# Patient Record
Sex: Female | Born: 2000 | Race: White | Hispanic: No | Marital: Single | Smoking: Never smoker
Health system: Southern US, Community
[De-identification: ages and names within clinical notes are randomized; demographics above are authoritative.]

## PROBLEM LIST (undated history)

## (undated) DIAGNOSIS — J3501 Chronic tonsillitis: Secondary | ICD-10-CM

## (undated) DIAGNOSIS — R55 Syncope and collapse: Secondary | ICD-10-CM

## (undated) DIAGNOSIS — K589 Irritable bowel syndrome without diarrhea: Secondary | ICD-10-CM

## (undated) DIAGNOSIS — G43909 Migraine, unspecified, not intractable, without status migrainosus: Secondary | ICD-10-CM

## (undated) DIAGNOSIS — G43109 Migraine with aura, not intractable, without status migrainosus: Secondary | ICD-10-CM

## (undated) HISTORY — DX: Migraine with aura, not intractable, without status migrainosus: G43.109

## (undated) HISTORY — PX: WISDOM TOOTH EXTRACTION: SHX21

## (undated) HISTORY — DX: Syncope and collapse: R55

---
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
## 2019-03-12 ENCOUNTER — Other Ambulatory Visit: Payer: Self-pay

## 2019-03-12 ENCOUNTER — Ambulatory Visit
Admission: EM | Admit: 2019-03-12 | Discharge: 2019-03-12 | Disposition: A | Discharge status: Home / Self Care | Payer: Medicaid Other | Attending: Family Medicine | Admitting: Family Medicine

## 2019-03-12 DIAGNOSIS — M7918 Myalgia, other site: Secondary | ICD-10-CM | POA: Diagnosis not present

## 2019-03-12 DIAGNOSIS — K589 Irritable bowel syndrome without diarrhea: Secondary | ICD-10-CM | POA: Diagnosis not present

## 2019-03-12 LAB — URINALYSIS, COMPLETE (UACMP) WITH MICROSCOPIC
Bilirubin Urine: NEGATIVE
Glucose, UA: NEGATIVE mg/dL
Hgb urine dipstick: NEGATIVE
Ketones, ur: NEGATIVE mg/dL
Leukocytes,Ua: NEGATIVE
Nitrite: NEGATIVE
Protein, ur: NEGATIVE mg/dL
Specific Gravity, Urine: >1.03 — ABNORMAL HIGH (ref 1.005–1.030)
pH: 5.5 (ref 5.0–8.0)

## 2019-03-12 MED ORDER — MELOXICAM 15 MG PO TABS: 15.0000 mg | ORAL_TABLET | Freq: Every day | ORAL | 0 refills | Status: DC | PRN

## 2019-03-12 NOTE — Discharge Instructions (Signed)
No evidence of UTI.  Exam unremarkable.  Medication as needed for pain.  Take care  Dr. Adriana Simas

## 2019-03-12 NOTE — ED Provider Notes (Signed)
[facility]    CSN: [id number] Arrival date & time: 03/12/19  1305  History   Chief Complaint Chief Complaint  Patient presents with  . Back Pain   HPI  19 year old female presents with multiple complaints.  Patient reports that her symptoms started on Sunday.  She reports neck pain, back pain, chest pain.  She states that her chest pain has now resolved.  However, she now has lower abdominal pain, left side pain, and continued back and neck pain.  Patient denies any urinary symptoms.  He had last rectal cycle was approximately 4 to 5 days ago.  Has known IBS.  Rates her pain as 3/10 in severity.  She has taken some muscle relaxers and use lidocaine patch for her back pain without resolution.  Back pain is bilateral.  No relieving factors.  No reports of fall, trauma, injury.  No other associated symptoms.  No other complaints.  Past Medical History:  Diagnosis Date  . Chronic tonsillitis   . Migraine with aura    every other day  . Syncope    Patient Active Problem List   Diagnosis Date Noted  . 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 TONSILLECTOMY N/A 09/13/2017   Procedure: TONSILLECTOMY;  Surgeon: Vernie Murders, MD;  Location: [facility];  Service: ENT;  Laterality: N/A;  NO ADENOIDS PER MD  . WISDOM TOOTH EXTRACTION      OB History    Gravida  0   Para  0   Term  0   Preterm  0   AB  0   Living  0     SAB  0   TAB  0   Ectopic  0   Multiple  0   Live Births  0            Home Medications    Prior to Admission medications   Medication Sig Start Date End Date Taking? Authorizing Provider  amitriptyline (ELAVIL) 25 MG tablet TAKE 1 TABLET BY MOUTH ONCE DAILY FOR 30 DAYS 08/08/18  Yes [provider]  dicyclomine (BENTYL) 10 MG capsule TAKE 1 CAPSULE BY MOUTH THREE TIMES DAILY AS NEEDED FOR STOMACH 08/08/18  Yes [provider]  meloxicam (MOBIC) 15 MG tablet Take 1  tablet (15 mg total) by mouth daily as needed for pain. 03/12/19   Tommie Sams, DO    Family History Family History  Problem Relation Age of Onset  . Cystic fibrosis Mother 65    Social History Social History   Tobacco Use  . Smoking status: Never Smoker  . Smokeless tobacco: Never Used  Substance Use Topics  . Alcohol use: No  . Drug use: No     Allergies   Patient has no known allergies.   Review of Systems Review of Systems  Cardiovascular: Positive for chest pain.  Gastrointestinal: Positive for abdominal pain.  Musculoskeletal: Positive for back pain and neck pain.   Physical Exam Triage Vital Signs ED Triage Vitals  Enc Vitals Group     BP 03/12/19 1323 121/70     Pulse Rate 03/12/19 1323 81     Resp 03/12/19 1323 18     Temp 03/12/19 1323 98.5 F (36.9 C)     Temp Source 03/12/19 1323 Oral     SpO2 03/12/19 1323 100 %     Weight 03/12/19 1321 140 lb (63.5 kg)     Height 03/12/19 1321  4\' 11"  (1.499 m)     Head Circumference --      Peak Flow --      Pain Score 03/12/19 1320 3     Pain Loc --      Pain Edu? --      Excl. in GC? --    Updated Vital Signs BP 121/70 (BP Location: Left Arm)   Pulse 81   Temp 98.5 F (36.9 C) (Oral)   Resp 18   Ht 4\' 11"  (1.499 m)   Wt 63.5 kg   LMP 03/07/2019   SpO2 100%   BMI 28.28 kg/m   Visual Acuity Right Eye Distance:   Left Eye Distance:   Bilateral Distance:    Right Eye Near:   Left Eye Near:    Bilateral Near:     Physical Exam Vitals and nursing note reviewed.  Constitutional:      General: She is not in acute distress.    Appearance: Normal appearance. She is not ill-appearing.  HENT:     Head: Normocephalic and atraumatic.  Eyes:     General:        Right eye: No discharge.        Left eye: No discharge.     Conjunctiva/sclera: Conjunctivae normal.  Cardiovascular:     Rate and Rhythm: Normal rate and regular rhythm.     Heart sounds: No murmur.  Pulmonary:     Effort: Pulmonary  effort is normal.     Breath sounds: Normal breath sounds. No wheezing, rhonchi or rales.  Abdominal:     General: There is no distension.     Palpations: Abdomen is soft.     Tenderness: There is no abdominal tenderness.  Musculoskeletal:     Comments: No lumbar spine tenderness to palpation.  Neurological:     Mental Status: She is alert.  Psychiatric:        Mood and Affect: Mood normal.        Behavior: Behavior normal.     UC Treatments / esults  Labs (all labs ordered are listed, but only abnormal results are displayed) Labs Reviewed  URINALYSIS, COMPLETE (UACMP) WITH MICROSCOPIC - Abnormal; Notable for the following components:      Result Value   APPearance HAZY (*)    Specific Gravity, Urine >1.030 (*)    Bacteria, UA RARE (*)    All other components within normal limits    EKG   Radiology No results found.  Procedures Procedures (including critical care time)  Medications Ordered in UC Medications - No data to display  Initial Impression / Assessment and Plan / UC Course  I have reviewed the triage vital signs and the nursing notes.  Pertinent labs & imaging results that were available during my care of the patient were reviewed by me and considered in my medical decision making (see chart for details).    19 year old female presents with musculoskeletal pain.  Patient is abdominal pain likely secondary to IBS.  She is well-appearing.  No evidence of UTI.  Meloxicam as directed.  Final Clinical Impressions(s) / UC Diagnoses   Final diagnoses:  Musculoskeletal pain  Irritable bowel syndrome, unspecified type     Discharge Instructions     No evidence of UTI.  Exam unremarkable.  Medication as needed for pain.  Take care  Dr. 03/09/2019    ED Prescriptions    Medication Sig Dispense Auth. Provider   meloxicam (MOBIC) 15 MG tablet Take 1 tablet (15  mg total) by mouth daily as needed for pain. 30 tablet Coral Spikes, DO     PDMP not reviewed  this encounter.   Coral Spikes, [place] 03/12/19 1435

## 2019-03-12 NOTE — ED Triage Notes (Signed)
Patient complains of low back pain that started on Sunday. States that she did take some muscle relaxers and lidocaine patch. Patient states that now she is having lower abdominal pain but reports that she has IBS but this feels different, has reported some flank pain.

## 2019-03-14 DIAGNOSIS — Z111 Encounter for screening for respiratory tuberculosis: Secondary | ICD-10-CM | POA: Diagnosis not present

## 2019-03-14 DIAGNOSIS — Z3049 Encounter for surveillance of other contraceptives: Secondary | ICD-10-CM | POA: Diagnosis not present

## 2019-04-23 ENCOUNTER — Telehealth: Payer: Self-pay

## 2019-04-23 DIAGNOSIS — R102 Pelvic and perineal pain: Secondary | ICD-10-CM

## 2019-04-23 NOTE — Telephone Encounter (Signed)
Patient called after hours/on call service to report lower abdominal pain and some bleeding. She was advised to report to ED. Records reviewed. No ED visit seen.

## 2019-04-23 NOTE — Telephone Encounter (Signed)
Pls call pt to sched, order placed. I'll call pt with results. Thx

## 2019-04-23 NOTE — Telephone Encounter (Signed)
Pt wants to go ahead and get u/s. She says pain she has doesn't feel like cramping "its more than just cramping" (her words).

## 2019-04-23 NOTE — Telephone Encounter (Signed)
Patient is schedule for 04/24/19

## 2019-04-23 NOTE — Telephone Encounter (Signed)
Cramping and bleeding normal with depo. Can check u/s if cramping persists, but most likely normal. Can do ibup with IBS and may need to do it short term till cramps improved. F/u prn.

## 2019-04-23 NOTE — Telephone Encounter (Signed)
Spoke w/patient to f/u. She didn't see the need to go to the ED. She reports she had been off depo for 6-7 months and restarted it thru her pediatrician on 03/14/19. Her LMP prior to recent depo was ~03/05/19. She has not had any bleeding since she received the depo until about 4 days ago (04/19/19). Pt reports bleeding started as light spotting and states that now it's heavy. Inquired w/patient if she is using pads or tampons. She reports she is using a light tampon. Explained our heavy bleeding protocol of completely filling regular size pad and having to change more than 1xqh. Patient now reports it's not heavy. She has tried heating pad and 800 Ibuprofen qd (she doesn't like to use d/t IBS). She has also tried tyelenol w/little to no relief. She states the pain is over her entire lower abdomen and is worse than a cramp (not like any pain she has ever had before). She reports only thing that helps is standing up. She is at work. Advised will send to [facility] for review/advice.

## 2019-04-24 ENCOUNTER — Ambulatory Visit (INDEPENDENT_AMBULATORY_CARE_PROVIDER_SITE_OTHER): Payer: Medicaid Other

## 2019-04-24 ENCOUNTER — Other Ambulatory Visit: Payer: Self-pay

## 2019-04-24 DIAGNOSIS — R102 Pelvic and perineal pain: Secondary | ICD-10-CM

## 2019-04-24 NOTE — Telephone Encounter (Signed)
Pt aware of neg GYN u/s for cramping with depo and BTB. Started depo 03/14/19. Having light bleeding. Hx of IBS too. Feels better today. Reassurance. F/u prn.

## 2019-04-28 ENCOUNTER — Encounter: Payer: Self-pay | Admitting: Obstetrics and Gynecology

## 2019-06-10 ENCOUNTER — Ambulatory Visit: Payer: Medicaid Other

## 2019-06-16 ENCOUNTER — Ambulatory Visit (INDEPENDENT_AMBULATORY_CARE_PROVIDER_SITE_OTHER): Payer: Medicaid Other | Admitting: Obstetrics and Gynecology

## 2019-06-16 ENCOUNTER — Other Ambulatory Visit (HOSPITAL_COMMUNITY)
Admission: RE | Admit: 2019-06-16 | Discharge: 2019-06-16 | Disposition: A | Discharge status: Home / Self Care | Payer: Medicaid Other | Source: Ambulatory Visit | Attending: Obstetrics and Gynecology | Admitting: Obstetrics and Gynecology

## 2019-06-16 ENCOUNTER — Encounter: Payer: Self-pay | Admitting: Obstetrics and Gynecology

## 2019-06-16 ENCOUNTER — Other Ambulatory Visit: Payer: Self-pay

## 2019-06-16 VITALS — BP 98/60 | Ht 60.0 in | Wt 147.0 lb

## 2019-06-16 DIAGNOSIS — Z3042 Encounter for surveillance of injectable contraceptive: Secondary | ICD-10-CM | POA: Diagnosis not present

## 2019-06-16 DIAGNOSIS — Z113 Encounter for screening for infections with a predominantly sexual mode of transmission: Secondary | ICD-10-CM | POA: Insufficient documentation

## 2019-06-16 DIAGNOSIS — N941 Unspecified dyspareunia: Secondary | ICD-10-CM

## 2019-06-16 DIAGNOSIS — N921 Excessive and frequent menstruation with irregular cycle: Secondary | ICD-10-CM | POA: Diagnosis not present

## 2019-06-16 LAB — POCT URINE PREGNANCY: Preg Test, Ur: NEGATIVE

## 2019-06-16 MED ORDER — MEDROXYPROGESTERONE ACETATE 150 MG/ML IM SUSP
150.0000 mg | Freq: Once | INTRAMUSCULAR | Status: AC
  Administered 2019-06-16: 150 mg via INTRAMUSCULAR

## 2019-06-16 NOTE — Addendum Note (Signed)
Addended by: Donnetta Hail on: 06/16/2019 04:17 PM   Modules accepted: Orders

## 2019-06-16 NOTE — Progress Notes (Signed)
[place] [employer] Complaint  Patient presents with  . Contraception    Has qs regarding depo, still having vaginal bleeding, pain with intercourse (for the past month)    HPI:      [name] is a 19 y.o. G0P0000 whose LMP was No LMP recorded. Patient has had an injection., presents today for dyspareunia for the past month. Has sharp pelvic pains during sex and has to stop. Sx stop with cessation. Sx improved this wk though. No pelvic pain otherwise. No vag sx. Has IBS, so hard to know about GI sx. No new partners. Neg STD testing 9/20.   Restarted depo 3/21 after being off it for 6-7 months. Having BTB almost daily since restarted. Had neg GYN u/s 4/21 after pt had concerns about quantity of bleeding. Late for 2nd depo today. Would like to cont for now. Hx of migraines with aura.    Past Medical History:  Diagnosis Date  . Chronic tonsillitis   . Migraine with aura    every other day  . Syncope     Past Surgical History:  Procedure Laterality Date  . TONSILLECTOMY N/A 09/13/2017   Procedure: TONSILLECTOMY;  Surgeon: Margaretha Sheffield, MD;  Location: [place];  Service: ENT;  Laterality: N/A;  NO ADENOIDS PER MD  . WISDOM TOOTH EXTRACTION      Family History  Problem Relation Age of Onset  . Cystic fibrosis Mother 22    Social History   Socioeconomic History  . Marital status: Single    Spouse name: Not on file  . Number of children: Not on file  . Years of education: Not on file  . Highest education level: Not on file  Occupational History  . Not on file  Tobacco Use  . Smoking status: Never Smoker  . Smokeless tobacco: Never Used  Substance and Sexual Activity  . Alcohol use: No  . Drug use: No  . Sexual activity: Yes    Birth control/protection: Injection  Other Topics Concern  . Not on file  Social History Narrative  . Not on file   Social Determinants of Health   Financial Resource Strain:   . Difficulty of  Paying Living Expenses:   Food Insecurity:   . Worried About Charity fundraiser in the Last Year:   . Arboriculturist in the Last Year:   Transportation Needs:   . Film/video editor (Medical):   Marland Kitchen Lack of Transportation (Non-Medical):   Physical Activity:   . Days of Exercise per Week:   . Minutes of Exercise per Session:   Stress:   . Feeling of Stress :   Social Connections:   . Frequency of Communication with Friends and Family:   . Frequency of Social Gatherings with Friends and Family:   . Attends Religious Services:   . Active Member of Clubs or Organizations:   . Attends Archivist Meetings:   Marland Kitchen Marital Status:   Intimate Partner Violence:   . Fear of Current or Ex-Partner:   . Emotionally Abused:   Marland Kitchen Physically Abused:   . Sexually Abused:     Outpatient Medications Prior to Visit  Medication Sig Dispense Refill  . amitriptyline (ELAVIL) 25 MG tablet TAKE 1 TABLET BY MOUTH ONCE DAILY FOR 30 DAYS    . dicyclomine (BENTYL) 10 MG capsule TAKE 1 CAPSULE BY MOUTH THREE TIMES DAILY AS NEEDED FOR STOMACH    . medroxyPROGESTERone (  DEPO-PROVERA) 150 MG/ML injection INJECT 1 VIAL INTRAMUSCULARLY    . meloxicam (MOBIC) 15 MG tablet Take 1 tablet (15 mg total) by mouth daily as needed for pain. 30 tablet 0   No facility-administered medications prior to visit.      ROS:  Review of Systems  Constitutional: Negative for fever.  Gastrointestinal: Negative for blood in stool, constipation, diarrhea, nausea and vomiting.  Genitourinary: Positive for dyspareunia and menstrual problem. Negative for dysuria, flank pain, frequency, hematuria, pelvic pain, urgency, vaginal bleeding, vaginal discharge and vaginal pain.  Musculoskeletal: Negative for back pain.  Skin: Negative for rash.    OBJECTIVE:   Vitals:  BP 98/60   Ht 5' (1.524 m)   Wt 147 lb (66.7 kg)   BMI 28.71 kg/m   Physical Exam Vitals reviewed.  Constitutional:      Appearance: She is  well-developed.  Pulmonary:     Effort: Pulmonary effort is normal.  Abdominal:     Palpations: Abdomen is soft.     Tenderness: There is no abdominal tenderness.  Genitourinary:    General: Normal vulva.     Pubic Area: No rash.      Labia:        Right: No rash, tenderness or lesion.        Left: No rash, tenderness or lesion.      Vagina: Normal. No vaginal discharge, erythema or tenderness.     Cervix: Normal.     Uterus: Normal. Not enlarged and not tender.      Adnexa: Right adnexa normal and left adnexa normal.       Right: No mass or tenderness.         Left: No mass or tenderness.    Musculoskeletal:        General: Normal range of motion.     Cervical back: Normal range of motion.  Skin:    General: Skin is warm and dry.  Neurological:     General: No focal deficit present.     Mental Status: She is alert and oriented to person, place, and time.  Psychiatric:        Mood and Affect: Mood normal.        Behavior: Behavior normal.        Thought Content: Thought content normal.        Judgment: Judgment normal.     Results: Results for orders placed or performed in visit on 06/16/19 (from the past 24 hour(s))  POCT urine pregnancy     Status: Normal   Collection Time: 06/16/19  3:34 PM  Result Value Ref Range   Preg Test, Ur Negative Negative     Assessment/Plan: Dyspareunia in female--Sx improving. Neg GYN u/s 4/21. Neg exam today. Check STDs. If neg, f/u prn sx. Could be GI related.   Breakthrough bleeding on depo provera--2nd shot today. Discussed cessation of bleeding 80% of the time by 9 months. Late with depo today. F/u prn.   Screening for STD (sexually transmitted disease) - Plan: Cervicovaginal ancillary only  Encounter for Depo-Provera contraception - Plan: POCT urine pregnancy    Return if symptoms worsen or fail to improve.  Nelta Caudill B. Marbella Markgraf, PA-C 06/16/2019 3:53 PM

## 2019-06-16 NOTE — Patient Instructions (Signed)
I value your feedback and entrusting us with your care. If you get a [facility] patient survey, I would appreciate you taking the time to let us know about your experience today. Thank you!  As of December 19, 2018, your lab results will be released to your MyChart immediately, before I even have a chance to see them. Please give me time to review them and contact you if there are any abnormalities. Thank you for your patience.  

## 2019-06-18 LAB — CERVICOVAGINAL ANCILLARY ONLY
Chlamydia: NEGATIVE
Comment: NEGATIVE
Comment: NORMAL
Neisseria Gonorrhea: NEGATIVE

## 2019-09-05 ENCOUNTER — Ambulatory Visit: Payer: Medicaid Other

## 2019-09-09 ENCOUNTER — Ambulatory Visit (INDEPENDENT_AMBULATORY_CARE_PROVIDER_SITE_OTHER): Payer: BC Managed Care – PPO

## 2019-09-09 ENCOUNTER — Other Ambulatory Visit: Payer: Self-pay

## 2019-09-09 DIAGNOSIS — Z3042 Encounter for surveillance of injectable contraceptive: Secondary | ICD-10-CM | POA: Diagnosis not present

## 2019-09-09 MED ORDER — MEDROXYPROGESTERONE ACETATE 150 MG/ML IM SUSP
150.0000 mg | Freq: Once | INTRAMUSCULAR | Status: AC
  Administered 2019-09-09: 150 mg via INTRAMUSCULAR

## 2019-09-09 NOTE — Progress Notes (Signed)
Pt here for depo which was given IM right glut.  NDC# 0548-5400-00 

## 2019-10-22 DIAGNOSIS — Z23 Encounter for immunization: Secondary | ICD-10-CM | POA: Diagnosis not present

## 2019-10-22 DIAGNOSIS — G43709 Chronic migraine without aura, not intractable, without status migrainosus: Secondary | ICD-10-CM | POA: Diagnosis not present

## 2019-11-21 DIAGNOSIS — Z79899 Other long term (current) drug therapy: Secondary | ICD-10-CM | POA: Diagnosis not present

## 2019-11-21 DIAGNOSIS — G43009 Migraine without aura, not intractable, without status migrainosus: Secondary | ICD-10-CM | POA: Diagnosis not present

## 2019-12-02 ENCOUNTER — Ambulatory Visit (INDEPENDENT_AMBULATORY_CARE_PROVIDER_SITE_OTHER): Payer: Medicaid Other

## 2019-12-02 ENCOUNTER — Other Ambulatory Visit: Payer: Self-pay

## 2019-12-02 DIAGNOSIS — Z3042 Encounter for surveillance of injectable contraceptive: Secondary | ICD-10-CM | POA: Diagnosis not present

## 2019-12-02 MED ORDER — MEDROXYPROGESTERONE ACETATE 150 MG/ML IM SUSP
150.0000 mg | Freq: Once | INTRAMUSCULAR | Status: AC
  Administered 2019-12-02: 150 mg via INTRAMUSCULAR

## 2019-12-02 NOTE — Progress Notes (Signed)
Patient presents today for Depo Provera injection within dates. Given IM RUOQ. Patient tolerated well. 

## 2019-12-05 DIAGNOSIS — G43009 Migraine without aura, not intractable, without status migrainosus: Secondary | ICD-10-CM | POA: Diagnosis not present

## 2020-01-08 DIAGNOSIS — H04123 Dry eye syndrome of bilateral lacrimal glands: Secondary | ICD-10-CM | POA: Diagnosis not present

## 2020-01-08 DIAGNOSIS — R519 Headache, unspecified: Secondary | ICD-10-CM | POA: Diagnosis not present

## 2020-01-22 DIAGNOSIS — H5213 Myopia, bilateral: Secondary | ICD-10-CM | POA: Diagnosis not present

## 2020-01-28 ENCOUNTER — Other Ambulatory Visit: Payer: Self-pay

## 2020-01-28 ENCOUNTER — Ambulatory Visit
Admission: EM | Admit: 2020-01-28 | Discharge: 2020-01-28 | Disposition: A | Discharge status: Home / Self Care | Payer: Medicaid Other | Attending: Sports Medicine | Admitting: Sports Medicine

## 2020-01-28 ENCOUNTER — Encounter: Payer: Self-pay | Admitting: Emergency Medicine

## 2020-01-28 DIAGNOSIS — Z793 Long term (current) use of hormonal contraceptives: Secondary | ICD-10-CM | POA: Insufficient documentation

## 2020-01-28 DIAGNOSIS — U071 COVID-19: Secondary | ICD-10-CM | POA: Insufficient documentation

## 2020-01-28 DIAGNOSIS — Z79899 Other long term (current) drug therapy: Secondary | ICD-10-CM | POA: Insufficient documentation

## 2020-01-28 DIAGNOSIS — R519 Headache, unspecified: Secondary | ICD-10-CM | POA: Diagnosis present

## 2020-01-28 DIAGNOSIS — J069 Acute upper respiratory infection, unspecified: Secondary | ICD-10-CM | POA: Diagnosis not present

## 2020-01-28 LAB — SARS CORONAVIRUS 2 (TAT 6-24 HRS): SARS Coronavirus 2: POSITIVE — AB

## 2020-01-28 NOTE — Discharge Instructions (Addendum)
Isolate at home until the results of your COVID test are back. If you are positive for COVID then you will have to quarantine for 5 days from when your symptoms started. After the 5 days you can break quarantine if your symptoms have improved and you have not had a fever for 24 hours without taking Tylenol or ibuprofen.  Use over-the-counter Tylenol and ibuprofen as needed for fever or pain.  You can also try taking Advil Cold and Sinus to help relieve some of your nasal congestion which may be adding to your symptoms.  If you do not have COVID, and your headache persists, I would contact your neurologist to discuss options.  If you develop shortness of breath-especially at rest, you are unable to speak in full sentences, or is a late sign your lips are turning blue you need to go to the ER for evaluation.

## 2020-01-28 NOTE — ED Triage Notes (Signed)
Patient states that she has been having a headache x 5 days, sore throat, nasal congestion, runny nose, ear pressure, facial warmness. Patient states that at home test was negative but would like PCR test.

## 2020-01-28 NOTE — ED Provider Notes (Signed)
[facility]    CSN: [id number] Arrival date & time: 01/28/20  0801      History   Chief Complaint Chief Complaint  Patient presents with  . Headache    HPI Robyn Mcmahon is a 20 y.o. female.   HPI   20 year old female here for evaluation of headache that has been going on for last 5 days.  Patient reports that this is a frontal headache and it is the same pattern and presentation as her migraines. Patient has taken her Ubrelvy and Topamax without relief. Additionally, she has been complaining of a runny nose for the last 2 to 3 weeks and she developed a sore throat and nasal congestion 2 to 3 days ago. T-max was 100 yesterday. Patient is also reporting popping in both of her ears, green and bloody nasal discharge, intermittent dizziness, and nausea. Patient denies aura, shortness of breath or wheezing, vomiting or diarrhea, changes to her sense of taste or smell, or COVID exposure. Patient has been vaccine against flu but not COVID.  Past Medical History:  Diagnosis Date  . Chronic tonsillitis   . Migraine with aura    every other day  . Syncope     Patient Active Problem List   Diagnosis Date Noted  . 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 TONSILLECTOMY N/A 09/13/2017   Procedure: TONSILLECTOMY;  Surgeon: Vernie Murders, MD;  Location: [facility];  Service: ENT;  Laterality: N/A;  NO ADENOIDS PER MD  . WISDOM TOOTH EXTRACTION      OB History    Gravida  0   Para  0   Term  0   Preterm  0   AB  0   Living  0     SAB  0   IAB  0   Ectopic  0   Multiple  0   Live Births  0            Home Medications    Prior to Admission medications   Medication Sig Start Date End Date Taking? Authorizing Provider  amitriptyline (ELAVIL) 25 MG tablet TAKE 1 TABLET BY MOUTH ONCE DAILY FOR 30 DAYS 08/08/18  Yes [provider]  dicyclomine (BENTYL) 10 MG capsule TAKE 1 CAPSULE BY MOUTH  THREE TIMES DAILY AS NEEDED FOR STOMACH 08/08/18  Yes [provider]  medroxyPROGESTERone (DEPO-PROVERA) 150 MG/ML injection INJECT 1 VIAL INTRAMUSCULARLY 03/11/19  Yes [provider]  topiramate (TOPAMAX) 25 MG tablet Take by mouth. 12/05/19  Yes [provider]  UBRELVY 50 MG TABS SMARTSIG:50 Milligram(s) By Mouth As Directed 01/11/20  Yes [provider]    Family History Family History  Problem Relation Age of Onset  . Cystic fibrosis Mother 58    Social History Social History   Tobacco Use  . Smoking status: Never Smoker  . Smokeless tobacco: Never Used  Vaping Use  . Vaping Use: Every day  Substance Use Topics  . Alcohol use: No  . Drug use: No     Allergies   Patient has no known allergies.   Review of Systems Review of Systems  Constitutional: Positive for fever. Negative for activity change and appetite change.  HENT: Positive for congestion, ear pain, postnasal drip, rhinorrhea, sinus pressure and sore throat.   Respiratory: Negative for cough, shortness of breath and wheezing.   Gastrointestinal: Positive for nausea. Negative for diarrhea and vomiting.  Musculoskeletal: Negative for arthralgias  and myalgias.  Skin: Negative for rash.  Neurological: Positive for dizziness and headaches. Negative for syncope.  Hematological: Negative.   Psychiatric/Behavioral: Negative.      Physical Exam Triage Vital Signs ED Triage Vitals  Enc Vitals Group     BP      Pulse      Resp      Temp      Temp src      SpO2      Weight      Height      Head Circumference      Peak Flow      Pain Score      Pain Loc      Pain Edu?      Excl. in GC?    No data found.  Updated Vital Signs BP 107/78 (BP Location: Right Arm)   Pulse (!) 101   Temp 98.6 F (37 C) (Oral)   Resp 18   Ht 4' 11.5" (1.511 m)   Wt 156 lb 3.2 oz (70.9 kg)   SpO2 99%   BMI 31.02 kg/m   Visual Acuity Right Eye Distance:   Left Eye Distance:    Bilateral Distance:    Right Eye Near:   Left Eye Near:    Bilateral Near:     Physical Exam Vitals and nursing note reviewed.  Constitutional:      General: She is not in acute distress.    Appearance: Normal appearance. She is well-developed.  HENT:     Head: Normocephalic and atraumatic.     Ears:     Comments: Both external auditory canals are occluded by cerumen. Patient's declining irrigation at this time.    Nose: Congestion and rhinorrhea present.     Mouth/Throat:     Mouth: Mucous membranes are moist.     Pharynx: Oropharynx is clear. Posterior oropharyngeal erythema present.  Cardiovascular:     Rate and Rhythm: Normal rate and regular rhythm.     Pulses: Normal pulses.     Heart sounds: Normal heart sounds. No murmur heard. No gallop.   Pulmonary:     Effort: Pulmonary effort is normal.     Breath sounds: Normal breath sounds. No wheezing, rhonchi or rales.  Musculoskeletal:     Cervical back: Normal range of motion and neck supple.  Lymphadenopathy:     Cervical: No cervical adenopathy.  Skin:    General: Skin is warm and dry.     Capillary Refill: Capillary refill takes less than 2 seconds.     Findings: No erythema or rash.  Neurological:     General: No focal deficit present.     Mental Status: She is alert and oriented to person, place, and time.  Psychiatric:        Mood and Affect: Mood normal.        Behavior: Behavior normal.        Thought Content: Thought content normal.        Judgment: Judgment normal.      UC Treatments / Results  Labs (all labs ordered are listed, but only abnormal results are displayed) Labs Reviewed  SARS CORONAVIRUS 2 (TAT 6-24 HRS)    EKG   Radiology No results found.  Procedures Procedures (including critical care time)  Medications Ordered in UC Medications - No data to display  Initial Impression / Assessment and Plan / UC Course  I have reviewed the triage vital signs and the nursing  notes.  Pertinent labs & imaging results that were available during my care of the patient were reviewed by me and considered in my medical decision making (see chart for details).   Patient is here for evaluation of upper respiratory symptoms and a headache that is been going on for the past 5 days. Patient reports that it is her normal migraine presentation but is not responding to her Bernita Raisin or Topamax. Additionally she has had some nasal congestion and nasal discharge for the past 2 to 3 days. Patient is currently being evaluated by [facility] neurology for her migraines. Physical exam reveals some mild nasal coastal edema and erythema with some yellow-green discharge. Frontal sinuses are tender to percussion. Posterior oropharynx is mildly erythematous with clear postnasal drip. No cervical lymphadenopathy appreciated. Lungs are clear to auscultation. Patient reports that her migraines typically respond to her regimen and it is unusual that they are not and this is what prompted her to be evaluated for possible COVID. Patient did take an at home test that was negative but she says she does not trust it. Will send COVID PCR and patient isolate at home pending the results. Patient advised that if she is COVID-negative, and her migraine persists, she should reach out to her neurologist to discuss options including magnesium sulfate or caffeine infusions. Patient also advised that if she is COVID-negative a short course of steroids may be helpful.   Final Clinical Impressions(s) / UC Diagnoses   Final diagnoses:  Upper respiratory tract infection, unspecified type     Discharge Instructions     Isolate at home until the results of your COVID test are back. If you are positive for COVID then you will have to quarantine for 5 days from when your symptoms started. After the 5 days you can break quarantine if your symptoms have improved and you have not had a fever for 24 hours without taking Tylenol or  ibuprofen.  Use over-the-counter Tylenol and ibuprofen as needed for fever or pain.  You can also try taking Advil Cold and Sinus to help relieve some of your nasal congestion which may be adding to your symptoms.  If you do not have COVID, and your headache persists, I would contact your neurologist to discuss options.  If you develop shortness of breath-especially at rest, you are unable to speak in full sentences, or is a late sign your lips are turning blue you need to go to the ER for evaluation.    ED Prescriptions    None     PDMP not reviewed this encounter.   Becky Augusta, NP 01/28/20 [phone]

## 2020-02-11 ENCOUNTER — Other Ambulatory Visit: Payer: Self-pay

## 2020-02-11 ENCOUNTER — Encounter: Payer: Self-pay | Admitting: Obstetrics and Gynecology

## 2020-02-11 ENCOUNTER — Ambulatory Visit (INDEPENDENT_AMBULATORY_CARE_PROVIDER_SITE_OTHER): Payer: Medicaid Other | Admitting: Obstetrics and Gynecology

## 2020-02-11 VITALS — BP 100/70 | Ht 59.0 in | Wt 157.0 lb

## 2020-02-11 DIAGNOSIS — N921 Excessive and frequent menstruation with irregular cycle: Secondary | ICD-10-CM | POA: Diagnosis not present

## 2020-02-11 DIAGNOSIS — G43119 Migraine with aura, intractable, without status migrainosus: Secondary | ICD-10-CM | POA: Diagnosis not present

## 2020-02-11 DIAGNOSIS — Z30011 Encounter for initial prescription of contraceptive pills: Secondary | ICD-10-CM | POA: Diagnosis not present

## 2020-02-11 MED ORDER — NORETHINDRONE 0.35 MG PO TABS: 1.0000 | ORAL_TABLET | Freq: Every day | ORAL | 0 refills | Status: DC

## 2020-02-11 NOTE — Progress Notes (Signed)
Pa, [employer] Complaint  Patient presents with  . Contraception    Wants to get off depo due to migraines, not interested in new [facility] for now    HPI:      [name] is a 20 y.o. G0P0000 whose LMP was No LMP recorded. Patient has had an injection., presents today for [facility] change. Off and on depo past few yrs for [facility] due to hx of migraines with aura. Cont to have frequent BTB. Migraines also worse recently. Saw neuro and given new med with sx improvement until pt had covid. Meds stopped working then and pt now on new med with neuro. Would like to come off depo due to BTB and in case cause of worsening migraines. Hasn't been sex active in awhile due to BTB. Last depo 12/02/19, started 3/21. Is interested in POPs. Hx of menometrorrhagia/severe periods off BC in past.   Annual/pap due. Pt declines today due to BTB. Will do at [facility] f/u in 3 months.   Past Medical History:  Diagnosis Date  . Chronic tonsillitis   . Migraine with aura    every other day  . Syncope     Past Surgical History:  Procedure Laterality Date  . TONSILLECTOMY N/A 09/13/2017   Procedure: TONSILLECTOMY;  Surgeon: Vernie Murders, MD;  Location: [facility];  Service: ENT;  Laterality: N/A;  NO ADENOIDS PER MD  . WISDOM TOOTH EXTRACTION      Family History  Problem Relation Age of Onset  . Cystic fibrosis Mother 54    Social History   Socioeconomic History  . Marital status: Single    Spouse name: Not on file  . Number of children: Not on file  . Years of education: Not on file  . Highest education level: Not on file  Occupational History  . Not on file  Tobacco Use  . Smoking status: Never Smoker  . Smokeless tobacco: Never Used  Vaping Use  . Vaping Use: Every day  Substance and Sexual Activity  . Alcohol use: No  . Drug use: No  . Sexual activity: Not Currently    Birth control/protection: Injection  Other Topics Concern  . Not on file  Social History  Narrative  . Not on file   Social Determinants of Health   Financial Resource Strain: Not on file  Food Insecurity: Not on file  Transportation Needs: Not on file  Physical Activity: Not on file  Stress: Not on file  Social Connections: Not on file  Intimate Partner Violence: Not on file    Outpatient Medications Prior to Visit  Medication Sig Dispense Refill  . amitriptyline (ELAVIL) 25 MG tablet TAKE 1 TABLET BY MOUTH ONCE DAILY FOR 30 DAYS    . cefUROXime (CEFTIN) 500 MG tablet Take by mouth.    . dicyclomine (BENTYL) 10 MG capsule TAKE 1 CAPSULE BY MOUTH THREE TIMES DAILY AS NEEDED FOR STOMACH    . SUMAtriptan (IMITREX) 50 MG tablet Take by mouth.    . topiramate (TOPAMAX) 25 MG tablet Take by mouth.    . UBRELVY 50 MG TABS SMARTSIG:50 Milligram(s) By Mouth As Directed    . medroxyPROGESTERone (DEPO-PROVERA) 150 MG/ML injection INJECT 1 VIAL INTRAMUSCULARLY     No facility-administered medications prior to visit.      ROS:  Review of Systems  Constitutional: Negative for fever, malaise/fatigue and weight loss.  Gastrointestinal: Negative for blood in stool, constipation, diarrhea, nausea and vomiting.  Genitourinary: Positive for vaginal bleeding. Negative for dyspareunia, dysuria, flank pain, frequency, hematuria, urgency, vaginal discharge and vaginal pain.  Musculoskeletal: Negative for back pain.  Skin: Negative for itching and rash.  Neurological: Positive for dizziness and headaches.   BREAST: No symptoms   OBJECTIVE:   Vitals:  BP 100/70   Ht 4\' 11"  (1.499 m)   Wt 157 lb (71.2 kg)   BMI 31.71 kg/m   Physical Exam Vitals reviewed.  Constitutional:      Appearance: She is well-developed.  Pulmonary:     Effort: Pulmonary effort is normal.  Musculoskeletal:        General: Normal range of motion.     Cervical back: Normal range of motion.  Skin:    General: Skin is warm and dry.  Neurological:     General: No focal deficit present.     Mental  Status: She is alert and oriented to person, place, and time.     Cranial Nerves: No cranial nerve deficit.  Psychiatric:        Mood and Affect: Mood normal.        Behavior: Behavior normal.        Thought Content: Thought content normal.        Judgment: Judgment normal.     Assessment/Plan: Encounter for initial prescription of contraceptive pills - Plan: norethindrone (MICRONOR) 0.35 MG tablet; Will do POPs, start 02/29/20. Condoms for 1 wk. Rx camila. F/u in 3 months at annual/sooner prn.   Breakthrough bleeding on depo provera  Migraine with aura--see if sx improve off depo. If not, pt may want to go back since so convenient for her. Also discussed IUD as option.   Meds ordered this encounter  Medications  . norethindrone (MICRONOR) 0.35 MG tablet    Sig: Take 1 tablet (0.35 mg total) by mouth daily.    Dispense:  84 tablet    Refill:  0    Order Specific Question:   Supervising Provider    Answer:   03/02/20 Nadara Mustard      Return in about 3 months (around 05/10/2020) for annual.  Leonette Tischer B. Jonelle Bann, PA-C 02/11/2020 3:14 PM

## 2020-02-11 NOTE — Patient Instructions (Signed)
I value your feedback and you entrusting us with your care. If you get a [facility] patient survey, I would appreciate you taking the time to let us know about your experience today. Thank you! ? ? ?

## 2020-02-16 DIAGNOSIS — H5213 Myopia, bilateral: Secondary | ICD-10-CM | POA: Diagnosis not present

## 2020-02-24 ENCOUNTER — Ambulatory Visit: Payer: Medicaid Other

## 2020-03-04 DIAGNOSIS — R519 Headache, unspecified: Secondary | ICD-10-CM | POA: Diagnosis not present

## 2020-05-07 NOTE — Progress Notes (Signed)
PCP:  [facility], [facility]   Chief Complaint  Patient presents with  . Gynecologic Exam    No concerns     HPI:      [name] is a 20 y.o. G0P0000 whose LMP was No LMP recorded. (Menstrual status: Oral contraceptives)., presents today for her annual examination.  Her menses are absent on POPs (and stopped depo 2/22), has random bleeding/spotting. No dysmen. Started POPs 2/22 for menometrorrhagia. Was on depo and having BTB and worsening migraines with aura. Wanted to come off depo for cycle control and to see if contributing to HAs. Seeing neruo. Also discussed IUD in past.  Sex activity: not sexually active.  Last Pap: N/A in past due to age Hx of STDs: none  There is no FH of breast cancer. There is no FH of ovarian cancer. The patient does not do self-breast exams.  Tobacco use: The patient denies current or previous tobacco use. Alcohol use: none No drug use.  Exercise: moderately active  She does get adequate calcium and Vitamin D in her diet.   Unsure if Gardasil done  Past Medical History:  Diagnosis Date  . Chronic tonsillitis   . Migraine with aura    every other day  . Syncope     Past Surgical History:  Procedure Laterality Date  . TONSILLECTOMY N/A 09/13/2017   Procedure: TONSILLECTOMY;  Surgeon: Vernie Murders, MD;  Location: [facility];  Service: ENT;  Laterality: N/A;  NO ADENOIDS PER MD  . WISDOM TOOTH EXTRACTION      Family History  Problem Relation Age of Onset  . Cystic fibrosis Mother 52    Social History   Socioeconomic History  . Marital status: Single    Spouse name: Not on file  . Number of children: Not on file  . Years of education: Not on file  . Highest education level: Not on file  Occupational History  . Not on file  Tobacco Use  . Smoking status: Never Smoker  . Smokeless tobacco: Never Used  Vaping Use  . Vaping Use: Every day  Substance and Sexual Activity  . Alcohol use: No  . Drug use: No   . Sexual activity: Not Currently    Birth control/protection: Pill  Other Topics Concern  . Not on file  Social History Narrative  . Not on file   Social Determinants of Health   Financial Resource Strain: Not on file  Food Insecurity: Not on file  Transportation Needs: Not on file  Physical Activity: Not on file  Stress: Not on file  Social Connections: Not on file  Intimate Partner Violence: Not on file     Current Outpatient Medications:  .  amitriptyline (ELAVIL) 25 MG tablet, TAKE 1 TABLET BY MOUTH ONCE DAILY FOR 30 DAYS, Disp: , Rfl:  .  dicyclomine (BENTYL) 10 MG capsule, TAKE 1 CAPSULE BY MOUTH THREE TIMES DAILY AS NEEDED FOR STOMACH, Disp: , Rfl:  .  SUMAtriptan (IMITREX) 50 MG tablet, Take by mouth., Disp: , Rfl:  .  topiramate (TOPAMAX) 25 MG tablet, Take by mouth., Disp: , Rfl:  .  UBRELVY 50 MG TABS, SMARTSIG:50 Milligram(s) By Mouth As Directed, Disp: , Rfl:  .  norethindrone (MICRONOR) 0.35 MG tablet, Take 1 tablet (0.35 mg total) by mouth daily., Disp: 84 tablet, Rfl: 3     ROS:  Review of Systems  Constitutional: Negative for fatigue, fever and unexpected weight change.  Respiratory: Negative for cough, shortness of  breath and wheezing.   Cardiovascular: Negative for chest pain, palpitations and leg swelling.  Gastrointestinal: Negative for blood in stool, constipation, diarrhea, nausea and vomiting.  Endocrine: Negative for cold intolerance, heat intolerance and polyuria.  Genitourinary: Negative for dyspareunia, dysuria, flank pain, frequency, genital sores, hematuria, menstrual problem, pelvic pain, urgency, vaginal bleeding, vaginal discharge and vaginal pain.  Musculoskeletal: Negative for back pain, joint swelling and myalgias.  Skin: Negative for rash.  Neurological: Positive for headaches. Negative for dizziness, syncope, light-headedness and numbness.  Hematological: Negative for adenopathy.  Psychiatric/Behavioral: Negative for agitation,  confusion, sleep disturbance and suicidal ideas. The patient is not nervous/anxious.   BREAST: No symptoms   Objective: BP 114/80   Ht 4\' 11"  (1.499 m)   Wt 157 lb (71.2 kg)   BMI 31.71 kg/m    Physical Exam Constitutional:      Appearance: She is well-developed.  Genitourinary:     Vulva normal.     Right Labia: No rash, tenderness or lesions.    Left Labia: No tenderness, lesions or rash.    No vaginal discharge, erythema or tenderness.      Right Adnexa: not tender and no mass present.    Left Adnexa: not tender and no mass present.    No cervical friability or polyp.     Uterus is not enlarged or tender.  Breasts:     Right: No mass, nipple discharge, skin change or tenderness.     Left: No mass, nipple discharge, skin change or tenderness.    Neck:     Thyroid: No thyromegaly.  Cardiovascular:     Rate and Rhythm: Normal rate and regular rhythm.     Heart sounds: Normal heart sounds. No murmur heard.   Pulmonary:     Effort: Pulmonary effort is normal.     Breath sounds: Normal breath sounds.  Abdominal:     Palpations: Abdomen is soft.     Tenderness: There is no abdominal tenderness. There is no guarding or rebound.  Musculoskeletal:        General: Normal range of motion.     Cervical back: Normal range of motion.  Lymphadenopathy:     Cervical: No cervical adenopathy.  Neurological:     General: No focal deficit present.     Mental Status: She is alert and oriented to person, place, and time.     Cranial Nerves: No cranial nerve deficit.  Skin:    General: Skin is warm and dry.  Psychiatric:        Mood and Affect: Mood normal.        Behavior: Behavior normal.        Thought Content: Thought content normal.        Judgment: Judgment normal.  Vitals reviewed.     Assessment/Plan: Encounter for annual routine gynecological examination  Cervical cancer screening - Plan: Cytology - PAP  Screening for STD (sexually transmitted disease) - Plan:  Cytology - PAP  Encounter for surveillance of contraceptive pills - Plan: norethindrone (MICRONOR) 0.35 MG tablet  Meds ordered this encounter  Medications  . norethindrone (MICRONOR) 0.35 MG tablet    Sig: Take 1 tablet (0.35 mg total) by mouth daily.    Dispense:  84 tablet    Refill:  3    Order Specific Question:   Supervising Provider    Answer:   Nadara Mustard             GYN counsel adequate intake of  calcium and vitamin D, diet and exercise     F/U  Return in about 1 year (around 05/10/2021).  Salayah Meares B. Sybilla Malhotra, PA-C 05/10/2020 9:25 AM

## 2020-05-10 ENCOUNTER — Encounter: Payer: Self-pay | Admitting: Obstetrics and Gynecology

## 2020-05-10 ENCOUNTER — Other Ambulatory Visit: Payer: Self-pay

## 2020-05-10 ENCOUNTER — Ambulatory Visit (INDEPENDENT_AMBULATORY_CARE_PROVIDER_SITE_OTHER): Payer: Medicaid Other | Admitting: Obstetrics and Gynecology

## 2020-05-10 ENCOUNTER — Other Ambulatory Visit (HOSPITAL_COMMUNITY)
Admission: RE | Admit: 2020-05-10 | Discharge: 2020-05-10 | Disposition: A | Discharge status: Home / Self Care | Payer: Medicaid Other | Source: Ambulatory Visit | Attending: Obstetrics and Gynecology | Admitting: Obstetrics and Gynecology

## 2020-05-10 VITALS — BP 114/80 | Ht 59.0 in | Wt 157.0 lb

## 2020-05-10 DIAGNOSIS — Z01419 Encounter for gynecological examination (general) (routine) without abnormal findings: Secondary | ICD-10-CM | POA: Diagnosis not present

## 2020-05-10 DIAGNOSIS — Z113 Encounter for screening for infections with a predominantly sexual mode of transmission: Secondary | ICD-10-CM | POA: Insufficient documentation

## 2020-05-10 DIAGNOSIS — Z3041 Encounter for surveillance of contraceptive pills: Secondary | ICD-10-CM

## 2020-05-10 DIAGNOSIS — Z124 Encounter for screening for malignant neoplasm of cervix: Secondary | ICD-10-CM | POA: Diagnosis not present

## 2020-05-10 MED ORDER — NORETHINDRONE 0.35 MG PO TABS
1.0000 | ORAL_TABLET | Freq: Every day | ORAL | 3 refills | Status: DC
Start: 2020-05-10 — End: 2021-05-26

## 2020-05-10 NOTE — Patient Instructions (Signed)
I value your feedback and you entrusting us with your care. If you get a [facility] patient survey, I would appreciate you taking the time to let us know about your experience today. Thank you! ? ? ?

## 2020-05-12 LAB — CYTOLOGY - PAP
Chlamydia: NEGATIVE
Comment: NEGATIVE
Comment: NORMAL
Diagnosis: NEGATIVE
Diagnosis: REACTIVE
Neisseria Gonorrhea: NEGATIVE

## 2020-05-19 ENCOUNTER — Ambulatory Visit
Admission: EM | Admit: 2020-05-19 | Discharge: 2020-05-19 | Disposition: A | Discharge status: Home / Self Care | Payer: Medicaid Other | Attending: Emergency Medicine | Admitting: Emergency Medicine

## 2020-05-19 ENCOUNTER — Encounter: Payer: Self-pay | Admitting: Emergency Medicine

## 2020-05-19 ENCOUNTER — Other Ambulatory Visit: Payer: Self-pay

## 2020-05-19 DIAGNOSIS — K122 Cellulitis and abscess of mouth: Secondary | ICD-10-CM | POA: Diagnosis not present

## 2020-05-19 LAB — GROUP A STREP BY PCR: Group A Strep by PCR: NOT DETECTED

## 2020-05-19 MED ORDER — AZITHROMYCIN 250 MG PO TABS: 250.0000 mg | ORAL_TABLET | Freq: Every day | ORAL | 0 refills | Status: DC

## 2020-05-19 NOTE — Discharge Instructions (Addendum)
Take the azithromycin as directed by the package.  You will take 2 tablets today and then 1 tablet each subsequent day for total of 5 days.  Use over-the-counter Tylenol and ibuprofen as needed for pain.  Gargle with warm salt water 2-3 times a day to help soothe your throat and reduce swelling which can in turn reduce pain.  Return for reevaluation for any new or worsening symptoms.

## 2020-05-19 NOTE — ED Triage Notes (Signed)
Patient c/o sore throat that started 3 days ago. Denies any other symptoms.

## 2020-05-19 NOTE — ED Provider Notes (Signed)
[facility]    CSN: [id number] Arrival date & time: 05/19/20  0802      History   Chief Complaint Chief Complaint  Patient presents with  . Sore Throat    HPI MARINELL IGARASHI is a 20 y.o. female.   HPI   20 year old female here for evaluation of sore throat.  Patient reports that she has been experiencing sore throat symptoms for the last 3 days that are getting worse in severity and not better.  Patient has had a tonsillectomy secondary to chronic tonsillar inflammation.  She reports that her pain is sharp in nature and that it hurts her to swallow her food but she is able to swallow food and manage her secretions.  Patient denies fever, runny nose, sinus pressure, or ear pain.  Patient does report that her ears have been popping on occasion.  Patient also denies cough, shortness of breath, or wheezing.  No GI complaints.  Past Medical History:  Diagnosis Date  . Chronic tonsillitis   . Migraine with aura    every other day  . Syncope     Patient Active Problem List   Diagnosis Date Noted  . Intractable migraine with aura without status migrainosus 02/11/2020  . 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 TONSILLECTOMY N/A 09/13/2017   Procedure: TONSILLECTOMY;  Surgeon: Vernie Murders, MD;  Location: [facility];  Service: ENT;  Laterality: N/A;  NO ADENOIDS PER MD  . WISDOM TOOTH EXTRACTION      OB History    Gravida  0   Para  0   Term  0   Preterm  0   AB  0   Living  0     SAB  0   IAB  0   Ectopic  0   Multiple  0   Live Births  0            Home Medications    Prior to Admission medications   Medication Sig Start Date End Date Taking? Authorizing Provider  amitriptyline (ELAVIL) 25 MG tablet TAKE 1 TABLET BY MOUTH ONCE DAILY FOR 30 DAYS 08/08/18  Yes [provider]  azithromycin (ZITHROMAX Z-PAK) 250 MG tablet Take 1 tablet (250 mg total) by mouth daily. Take 2  tablets on the first day and then 1 tablet daily thereafter for a total of 5 days of treatment. 05/19/20  Yes Becky Augusta, NP  dicyclomine (BENTYL) 10 MG capsule TAKE 1 CAPSULE BY MOUTH THREE TIMES DAILY AS NEEDED FOR STOMACH 08/08/18  Yes [provider]  norethindrone (MICRONOR) 0.35 MG tablet Take 1 tablet (0.35 mg total) by mouth daily. 05/10/20  Yes Copland, Helmut Muster B, PA-C  SUMAtriptan (IMITREX) 50 MG tablet Take by mouth. 09/22/19  Yes [provider]  topiramate (TOPAMAX) 25 MG tablet Take by mouth. 12/05/19  Yes [provider]  UBRELVY 50 MG TABS SMARTSIG:50 Milligram(s) By Mouth As Directed 01/11/20  Yes [provider]    Family History Family History  Problem Relation Age of Onset  . Cystic fibrosis Mother 70    Social History Social History   Tobacco Use  . Smoking status: Never Smoker  . Smokeless tobacco: Never Used  Vaping Use  . Vaping Use: Every day  Substance Use Topics  . Alcohol use: No  . Drug use: No     Allergies   Patient has no known allergies.   Review of Systems  Review of Systems  Constitutional: Negative for activity change, appetite change and fever.  HENT: Positive for sore throat. Negative for congestion, ear discharge, rhinorrhea, sinus pressure and sinus pain.   Respiratory: Negative for cough, shortness of breath and wheezing.   Gastrointestinal: Negative for abdominal pain, diarrhea, nausea and vomiting.  Musculoskeletal: Negative for arthralgias and myalgias.  Skin: Negative for rash.  Neurological: Negative for headaches.  Hematological: Negative.   Psychiatric/Behavioral: Negative.      Physical Exam Triage Vital Signs ED Triage Vitals  Enc Vitals Group     BP      Pulse      Resp      Temp      Temp src      SpO2      Weight      Height      Head Circumference      Peak Flow      Pain Score      Pain Loc      Pain Edu?      Excl. in GC?    No data found.  Updated Vital Signs BP  107/89 (BP Location: Right Arm)   Pulse 76   Temp 98.4 F (36.9 C) (Oral)   Resp 18   Ht 4\' 11"  (1.499 m)   Wt 156 lb 15.5 oz (71.2 kg)   SpO2 100%   BMI 31.70 kg/m   Visual Acuity Right Eye Distance:   Left Eye Distance:   Bilateral Distance:    Right Eye Near:   Left Eye Near:    Bilateral Near:     Physical Exam Vitals and nursing note reviewed.  Constitutional:      General: She is not in acute distress.    Appearance: She is well-developed. She is not ill-appearing.  HENT:     Head: Normocephalic and atraumatic.     Nose: No congestion or rhinorrhea.     Mouth/Throat:     Mouth: Mucous membranes are moist.     Pharynx: Uvula midline. Pharyngeal swelling and posterior oropharyngeal erythema present.  Cardiovascular:     Rate and Rhythm: Normal rate and regular rhythm.     Heart sounds: Normal heart sounds. No murmur heard.   Pulmonary:     Effort: Pulmonary effort is normal.     Breath sounds: Normal breath sounds. No wheezing, rhonchi or rales.  Musculoskeletal:     Cervical back: Normal range of motion and neck supple.  Lymphadenopathy:     Cervical: Cervical adenopathy present.  Skin:    General: Skin is warm and dry.     Capillary Refill: Capillary refill takes less than 2 seconds.     Findings: No erythema or rash.  Neurological:     General: No focal deficit present.     Mental Status: She is alert and oriented to person, place, and time.  Psychiatric:        Mood and Affect: Mood normal.        Behavior: Behavior normal.      UC Treatments / Results  Labs (all labs ordered are listed, but only abnormal results are displayed) Labs Reviewed  GROUP A STREP BY PCR    EKG   Radiology No results found.  Procedures Procedures (including critical care time)  Medications Ordered in UC Medications - No data to display  Initial Impression / Assessment and Plan / UC Course  I have reviewed the triage vital signs and the nursing  notes.  Pertinent labs & imaging results that were available during my care of the patient were reviewed by me and considered in my medical decision making (see chart for details).   Patient is a pleasant, nontoxic-appearing 20 year old female here for evaluation of sore throat pain that has been ongoing and worsening for the last 3 days.  Patient reports her pain is being sharp in nature and that it hurts to swallow her food.  She is able to swallow food and managing her own secretions.  She has not had a fever, upper or lower respiratory complaints, or GI complaints.  Patient's physical exam reveals cerumen impaction in both external auditory canals with complete occlusion of the tympanic membrane.  Nasal mucosa is pink and moist without discharge or edema.  Oropharyngeal exam reveals surgically absent tonsillar pillars but the soft palate and uvula are erythematous and mildly edematous.  Posterior oropharynx is pink and moist.  Patient does have bilateral shotty anterior cervical lymphadenopathy.  Cardiopulmonary exam is benign.  Strep PCR collected at triage.  Strep PCR is negative.  Will treat patient for uvulitis with azithromycin x5 days, over-the-counter Tylenol ibuprofen for discomfort, and salt water gargles.  Return precaution reviewed with patient.  Final Clinical Impressions(s) / UC Diagnoses   Final diagnoses:  Uvulitis     Discharge Instructions     Take the azithromycin as directed by the package.  You will take 2 tablets today and then 1 tablet each subsequent day for total of 5 days.  Use over-the-counter Tylenol and ibuprofen as needed for pain.  Gargle with warm salt water 2-3 times a day to help soothe your throat and reduce swelling which can in turn reduce pain.  Return for reevaluation for any new or worsening symptoms.    ED Prescriptions    Medication Sig Dispense Auth. Provider   azithromycin (ZITHROMAX Z-PAK) 250 MG tablet Take 1 tablet (250 mg total) by  mouth daily. Take 2 tablets on the first day and then 1 tablet daily thereafter for a total of 5 days of treatment. 6 tablet Becky Augusta, NP     PDMP not reviewed this encounter.   Becky Augusta, NP 05/19/20 [phone]

## 2020-08-02 DIAGNOSIS — H6123 Impacted cerumen, bilateral: Secondary | ICD-10-CM | POA: Diagnosis not present

## 2020-11-09 ENCOUNTER — Other Ambulatory Visit: Payer: Self-pay | Admitting: Obstetrics and Gynecology

## 2020-11-09 DIAGNOSIS — Z3041 Encounter for surveillance of contraceptive pills: Secondary | ICD-10-CM

## 2020-11-15 ENCOUNTER — Other Ambulatory Visit: Payer: Self-pay | Admitting: Obstetrics and Gynecology

## 2020-11-15 DIAGNOSIS — Z3041 Encounter for surveillance of contraceptive pills: Secondary | ICD-10-CM

## 2020-11-24 DIAGNOSIS — Z113 Encounter for screening for infections with a predominantly sexual mode of transmission: Secondary | ICD-10-CM | POA: Diagnosis not present

## 2020-11-24 DIAGNOSIS — Z23 Encounter for immunization: Secondary | ICD-10-CM | POA: Diagnosis not present

## 2020-11-24 DIAGNOSIS — Z13220 Encounter for screening for lipoid disorders: Secondary | ICD-10-CM | POA: Diagnosis not present

## 2020-11-24 DIAGNOSIS — G43009 Migraine without aura, not intractable, without status migrainosus: Secondary | ICD-10-CM | POA: Diagnosis not present

## 2020-11-24 DIAGNOSIS — F419 Anxiety disorder, unspecified: Secondary | ICD-10-CM | POA: Diagnosis not present

## 2020-11-24 DIAGNOSIS — Z1329 Encounter for screening for other suspected endocrine disorder: Secondary | ICD-10-CM | POA: Diagnosis not present

## 2020-11-24 DIAGNOSIS — Z1159 Encounter for screening for other viral diseases: Secondary | ICD-10-CM | POA: Diagnosis not present

## 2020-11-24 DIAGNOSIS — K589 Irritable bowel syndrome without diarrhea: Secondary | ICD-10-CM | POA: Insufficient documentation

## 2020-11-24 DIAGNOSIS — Z131 Encounter for screening for diabetes mellitus: Secondary | ICD-10-CM | POA: Diagnosis not present

## 2020-11-24 DIAGNOSIS — Z0000 Encounter for general adult medical examination without abnormal findings: Secondary | ICD-10-CM | POA: Diagnosis not present

## 2020-12-21 ENCOUNTER — Other Ambulatory Visit: Payer: Self-pay

## 2020-12-21 ENCOUNTER — Ambulatory Visit
Admission: EM | Admit: 2020-12-21 | Discharge: 2020-12-21 | Disposition: A | Discharge status: Home / Self Care | Payer: BC Managed Care – PPO | Attending: Internal Medicine | Admitting: Internal Medicine

## 2020-12-21 DIAGNOSIS — J111 Influenza due to unidentified influenza virus with other respiratory manifestations: Secondary | ICD-10-CM | POA: Diagnosis not present

## 2020-12-21 MED ORDER — OSELTAMIVIR PHOSPHATE 75 MG PO CAPS: 75.0000 mg | ORAL_CAPSULE | Freq: Two times a day (BID) | ORAL | 0 refills | Status: DC

## 2020-12-21 NOTE — ED Triage Notes (Addendum)
Patient presents to Urgent Care with complaints of runny nose and headache since she believes Saturday or Sunday. Not treating symptoms. 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 She says the student exposed Friday.   Denies fever.

## 2020-12-21 NOTE — ED Provider Notes (Signed)
[facility]    CSN: [id number] Arrival date & time: 12/21/20  1017      History   Chief Complaint Chief Complaint  Patient presents with   Fever   Nasal Congestion   Headache    HPI Robyn Mcmahon is a 20 y.o. female.  She presents today with fever, onset yesterday, with bad headache, left earache, and some runny nose.  Increased frequency of bowel movements without frank diarrhea.  Not vomiting.  Does have sore throat.  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  HPI  Past Medical History:  Diagnosis Date   Chronic tonsillitis    Migraine with aura    every other day   Syncope     Patient Active Problem List   Diagnosis Date Noted   Intractable migraine with aura without status migrainosus 02/11/2020   Heat syncope 10/07/2018    Past Surgical History:  Procedure Laterality Date   TONSILLECTOMY N/A 09/13/2017   Procedure: TONSILLECTOMY;  Surgeon: Vernie Murders, MD;  Location: [facility];  Service: ENT;  Laterality: N/A;  NO ADENOIDS PER MD   WISDOM TOOTH EXTRACTION      OB History     Gravida  0   Para  0   Term  0   Preterm  0   AB  0   Living  0      SAB  0   IAB  0   Ectopic  0   Multiple  0   Live Births  0            Home Medications    Prior to Admission medications   Medication Sig Start Date End Date Taking? Authorizing Provider  oseltamivir (TAMIFLU) 75 MG capsule Take 1 capsule (75 mg total) by mouth every 12 (twelve) hours. 12/21/20  Yes Isa Rankin, MD  amitriptyline (ELAVIL) 25 MG tablet TAKE 1 TABLET BY MOUTH ONCE DAILY FOR 30 DAYS 08/08/18   [provider]  dicyclomine (BENTYL) 10 MG capsule TAKE 1 CAPSULE BY MOUTH THREE TIMES DAILY AS NEEDED FOR STOMACH 08/08/18   [provider]  norethindrone (MICRONOR) 0.35 MG tablet Take 1 tablet (0.35 mg total) by mouth daily. 05/10/20   Copland, Ilona Sorrel, PA-C  SUMAtriptan (IMITREX) 50 MG tablet  Take by mouth. 09/22/19   [provider]  topiramate (TOPAMAX) 25 MG tablet Take by mouth. 12/05/19   [provider]  UBRELVY 50 MG TABS SMARTSIG:50 Milligram(s) By Mouth As Directed 01/11/20   [provider]    Family History Family History  Problem Relation Age of Onset   Cystic fibrosis Mother 81    Social History Social History   Tobacco Use   Smoking status: Never   Smokeless tobacco: Never  Vaping Use   Vaping Use: Every day  Substance Use Topics   Alcohol use: No   Drug use: No     Allergies   Patient has no known allergies.   Review of Systems Review of Systems see HPI   Physical Exam Triage Vital Signs ED Triage Vitals  Enc Vitals Group     BP 12/21/20 1053 (!) 124/94     Pulse Rate 12/21/20 1053 84     Resp 12/21/20 1053 16     Temp 12/21/20 1053 98.3 F (36.8 C)     Temp Source 12/21/20 1053 Oral     SpO2 12/21/20 1053 100 %  Weight --      Height --      Pain Score 12/21/20 1051 5     Pain Loc --     Updated Vital Signs BP (!) 124/94 (BP Location: Left Arm)    Pulse 84    Temp 98.3 F (36.8 C) (Oral)    Resp 16    SpO2 100%   Physical Exam Constitutional:      General: She is not in acute distress.    Appearance: She is not ill-appearing or toxic-appearing.  HENT:     Head: Atraumatic.     Comments: Bilateral TMs are unremarkable Moderate nasal congestion bilaterally Steer pharynx is moderately injected Eyes:     Conjunctiva/sclera:     Right eye: Right conjunctiva is not injected. No exudate.    Left eye: Left conjunctiva is not injected. No exudate.    Comments: Conjugate gaze observed  Cardiovascular:     Rate and Rhythm: Normal rate and regular rhythm.  Pulmonary:     Effort: Pulmonary effort is normal. No respiratory distress.     Breath sounds: No wheezing or rhonchi.  Abdominal:     General: There is no distension.  Musculoskeletal:     Comments: Walked into the urgent care independently   Skin:    General: Skin is warm and dry.     Coloration: Skin is not cyanotic.  Neurological:     Mental Status: She is alert.     Comments: Face is symmetric, speech is clear, coherent, logical     UC Treatments / Results  Labs (all labs ordered are listed, but only abnormal results are displayed) Labs Reviewed - No data to display No labs at urgent care visit  EKG N/A  Radiology No results found. No imaging at urgent care visit  Procedures Procedures (including critical care time) N/A  Medications Ordered in UC Medications - No data to display No meds at urgent care visit   Final Clinical Impressions(s) / UC Diagnoses   Final diagnoses:  Influenza-like illness     Discharge Instructions      Symptoms today seem most consistent with an influenza like illness.  Prescription for oseltamivir (for flu) was sent to the pharmacy.  Push fluids and rest.  Take tylenol or advil otc as needed for fever, discomfort.  Eat fruits and vegetables to help your immune system do its best work.  Anticipate gradual improvement over the next several days.  Recheck for new fever >100.5, increasing phlegm production/nasal discharge, or if not starting to improve in a few days.      ED Prescriptions     Medication Sig Dispense Auth. Provider   oseltamivir (TAMIFLU) 75 MG capsule Take 1 capsule (75 mg total) by mouth every 12 (twelve) hours. 10 capsule Isa Rankin, MD      PDMP not reviewed this encounter.   Isa Rankin, MD 12/22/20 [phone]

## 2020-12-21 NOTE — Discharge Instructions (Signed)
Symptoms today seem most consistent with an influenza-like illness.  Prescription for oseltamivir (for flu) was sent to the pharmacy.  Push fluids and rest.  Take tylenol or advil otc as needed for fever, discomfort.  Eat fruits and vegetables to help your immune system do its best work.  Anticipate gradual improvement over the next several days.  Recheck for new fever >100.5, increasing phlegm production/nasal discharge, or if not starting to improve in a few days.    

## 2021-03-14 ENCOUNTER — Other Ambulatory Visit: Payer: Self-pay

## 2021-03-14 ENCOUNTER — Ambulatory Visit
Admission: RE | Admit: 2021-03-14 | Discharge: 2021-03-14 | Disposition: A | Discharge status: Home / Self Care | Payer: BC Managed Care – PPO | Source: Ambulatory Visit | Attending: Emergency Medicine | Admitting: Emergency Medicine

## 2021-03-14 VITALS — BP 118/82 | HR 118 | Temp 98.6°F | Resp 18

## 2021-03-14 DIAGNOSIS — J069 Acute upper respiratory infection, unspecified: Secondary | ICD-10-CM | POA: Diagnosis not present

## 2021-03-14 HISTORY — DX: Irritable bowel syndrome, unspecified: K58.9

## 2021-03-14 MED ORDER — IPRATROPIUM BROMIDE 0.06 % NA SOLN: 2.0000 | Freq: Four times a day (QID) | NASAL | 12 refills | Status: DC

## 2021-03-14 MED ORDER — AMOXICILLIN-POT CLAVULANATE 875-125 MG PO TABS: 1.0000 | ORAL_TABLET | Freq: Two times a day (BID) | ORAL | 0 refills | Status: AC

## 2021-03-14 MED ORDER — BENZONATATE 100 MG PO CAPS: 200.0000 mg | ORAL_CAPSULE | Freq: Three times a day (TID) | ORAL | 0 refills | Status: DC

## 2021-03-14 MED ORDER — PROMETHAZINE-DM 6.25-15 MG/5ML PO SYRP: 5.0000 mL | ORAL_SOLUTION | Freq: Four times a day (QID) | ORAL | 0 refills | Status: DC | PRN

## 2021-03-14 NOTE — ED Provider Notes (Signed)
[facility]    CSN: [id number] Arrival date & time: 03/14/21  0936      History   Chief Complaint Chief Complaint  Patient presents with   Facial Pain    APPT 1000    HPI Robyn Mcmahon is a 21 y.o. female.   HPI  21 year old female here for evaluation of respiratory complaints. Been experiencing nasal congestion for 3 to 4 weeks as well as ear and sinus pressure for the last 3 to 4 days.  She states she had increased nasal discharge in the last 3 to 4 days and it is a green color.  She also indicates that her ears have a lot of pressure and slightly muffled hearing.  Her cough is mostly productive in the mornings and her mucus is still clear.  She denies fever.  She also denies any history of allergies.   Past Medical History:  Diagnosis Date   Chronic tonsillitis    Irritable bowel syndrome    Migraine with aura    every other day   Syncope     Patient Active Problem List   Diagnosis Date Noted   Intractable migraine with aura without status migrainosus 02/11/2020   Heat syncope 10/07/2018    Past Surgical History:  Procedure Laterality Date   TONSILLECTOMY N/A 09/13/2017   Procedure: TONSILLECTOMY;  Surgeon: Vernie Murders, MD;  Location: [facility];  Service: ENT;  Laterality: N/A;  NO ADENOIDS PER MD   WISDOM TOOTH EXTRACTION      OB History     Gravida  0   Para  0   Term  0   Preterm  0   AB  0   Living  0      SAB  0   IAB  0   Ectopic  0   Multiple  0   Live Births  0            Home Medications    Prior to Admission medications   Medication Sig Start Date End Date Taking? Authorizing Provider  amoxicillin-clavulanate (AUGMENTIN) 875-125 MG tablet Take 1 tablet by mouth every 12 (twelve) hours for 10 days. 03/14/21 03/24/21 Yes Becky Augusta, NP  benzonatate (TESSALON) 100 MG capsule Take 2 capsules (200 mg total) by mouth every 8 (eight) hours. 03/14/21  Yes Becky Augusta, NP  dicyclomine (BENTYL) 10 MG  capsule TAKE 1 CAPSULE BY MOUTH THREE TIMES DAILY AS NEEDED FOR STOMACH 08/08/18  Yes [provider]  ipratropium (ATROVENT) 0.06 % nasal spray Place 2 sprays into both nostrils 4 (four) times daily. 03/14/21  Yes Becky Augusta, NP  norethindrone (MICRONOR) 0.35 MG tablet Take 1 tablet (0.35 mg total) by mouth daily. 05/10/20  Yes Copland, Ilona Sorrel, PA-C  promethazine-dextromethorphan (PROMETHAZINE-DM) 6.25-15 MG/5ML syrup Take 5 mLs by mouth 4 (four) times daily as needed. 03/14/21  Yes Becky Augusta, NP  topiramate (TOPAMAX) 25 MG tablet Take by mouth. 12/05/19  Yes [provider]  UBRELVY 50 MG TABS SMARTSIG:50 Milligram(s) By Mouth As Directed 01/11/20  Yes [provider]    Family History Family History  Problem Relation Age of Onset   Cystic fibrosis Mother 78    Social History Social History   Tobacco Use   Smoking status: Never   Smokeless tobacco: Never  Vaping Use   Vaping Use: Every day   Substances: Nicotine, Flavoring  Substance Use Topics   Alcohol use: No   Drug use: No  Allergies   Patient has no known allergies.   Review of Systems Review of Systems  Constitutional:  Negative for activity change, appetite change and fever.  HENT:  Positive for congestion, ear pain, rhinorrhea and sinus pressure. Negative for ear discharge and hearing loss.   Respiratory:  Positive for cough. Negative for shortness of breath and wheezing.   Hematological: Negative.   Psychiatric/Behavioral: Negative.      Physical Exam Triage Vital Signs ED Triage Vitals  Enc Vitals Group     BP 03/14/21 0958 118/82     Pulse Rate 03/14/21 0958 (!) 118     Resp 03/14/21 0958 18     Temp 03/14/21 0958 98.6 F (37 C)     Temp Source 03/14/21 0958 Oral     SpO2 03/14/21 0958 99 %     Weight --      Height --      Head Circumference --      Peak Flow --      Pain Score 03/14/21 0955 0     Pain Loc --      Pain Edu? --      Excl. in GC? --    No data  found.  Updated Vital Signs BP 118/82 (BP Location: Left Arm)    Pulse (!) 118    Temp 98.6 F (37 C) (Oral)    Resp 18    LMP 03/13/2021    SpO2 99%   Visual Acuity Right Eye Distance:   Left Eye Distance:   Bilateral Distance:    Right Eye Near:   Left Eye Near:    Bilateral Near:     Physical Exam Vitals and nursing note reviewed.  Constitutional:      Appearance: Normal appearance. She is not ill-appearing.  HENT:     Head: Normocephalic and atraumatic.     Right Ear: Tympanic membrane, ear canal and external ear normal. There is no impacted cerumen.     Left Ear: Tympanic membrane, ear canal and external ear normal. There is no impacted cerumen.     Nose: Congestion and rhinorrhea present.     Mouth/Throat:     Mouth: Mucous membranes are moist.     Pharynx: Oropharynx is clear. No posterior oropharyngeal erythema.  Cardiovascular:     Rate and Rhythm: Normal rate and regular rhythm.     Pulses: Normal pulses.     Heart sounds: Normal heart sounds. No murmur heard.   No friction rub. No gallop.  Pulmonary:     Effort: Pulmonary effort is normal.     Breath sounds: Normal breath sounds. No wheezing, rhonchi or rales.  Musculoskeletal:     Cervical back: Normal range of motion and neck supple.  Lymphadenopathy:     Cervical: No cervical adenopathy.  Skin:    General: Skin is warm and dry.     Capillary Refill: Capillary refill takes less than 2 seconds.     Findings: No erythema or rash.  Neurological:     General: No focal deficit present.     Mental Status: She is alert and oriented to person, place, and time.  Psychiatric:        Mood and Affect: Mood normal.        Behavior: Behavior normal.        Thought Content: Thought content normal.        Judgment: Judgment normal.     UC Treatments / Results  Labs (all labs ordered are  listed, but only abnormal results are displayed) Labs Reviewed - No data to display  EKG   Radiology No results  found.  Procedures Procedures (including critical care time)  Medications Ordered in UC Medications - No data to display  Initial Impression / Assessment and Plan / UC Course  I have reviewed the triage vital signs and the nursing notes.  Pertinent labs & imaging results that were available during my care of the patient were reviewed by me and considered in my medical decision making (see chart for details).  Patient is a nontoxic-appearing 21 year old female here for evaluation of 3 to 4 weeks of nasal congestion that have gotten worse over last 3 to 4 days as described in the HPI above.  On exam patient has pearly-gray tympanic membranes bilaterally with normal light reflex and clear external auditory canals.  Nasal mucosa is erythematous and edematous with green discharge in both nares.  Oropharyngeal exam is benign.  No cervical of adenopathy appreciable exam.  Cardiopulmonary exam feels clung sounds in all fields.  Patient exam is consistent with upper respiratory function.  Given the duration of symptoms I feel a trial of antibiotics are warranted.  I will place her on Augmentin twice daily for 10 days.  I have also prescribed Atrovent nasal spray to help with nasal congestion, Tessalon Perles help with cough during the day, and Promethazine DM cough syrup to help with cough and congestion at bedtime.  Work note provided.   Final Clinical Impressions(s) / UC Diagnoses   Final diagnoses:  Acute upper respiratory infection     Discharge Instructions      Take the Augmentin twice daily with food for 10 days for treatment of your URI.   Use the Atrovent nasal spray, 2 squirts in each nostril every 6 hours, as needed for runny nose and postnasal drip.  Use the Tessalon Perles every 8 hours during the day.  Take them with a small sip of water.  They may give you some numbness to the base of your tongue or a metallic taste in your mouth, this is normal.  Use the Promethazine DM cough  syrup at bedtime for cough and congestion.  It will make you drowsy so do not take it during the day.  Return for reevaluation or see your primary care provider for any new or worsening symptoms.      ED Prescriptions     Medication Sig Dispense Auth. Provider   amoxicillin-clavulanate (AUGMENTIN) 875-125 MG tablet Take 1 tablet by mouth every 12 (twelve) hours for 10 days. 20 tablet Becky Augusta, NP   benzonatate (TESSALON) 100 MG capsule Take 2 capsules (200 mg total) by mouth every 8 (eight) hours. 21 capsule Becky Augusta, NP   ipratropium (ATROVENT) 0.06 % nasal spray Place 2 sprays into both nostrils 4 (four) times daily. 15 mL Becky Augusta, NP   promethazine-dextromethorphan (PROMETHAZINE-DM) 6.25-15 MG/5ML syrup Take 5 mLs by mouth 4 (four) times daily as needed. 118 mL Becky Augusta, NP      PDMP not reviewed this encounter.   Becky Augusta, NP 03/14/21 1025

## 2021-03-14 NOTE — ED Triage Notes (Signed)
Pt reports nasal congestion x a few weeks.  Ear and sinus pressure worsening x 3-4 days.  Increased nasal drainage since then.  No fevers, sore throat only when coughing, body aches.   ?

## 2021-03-14 NOTE — Discharge Instructions (Signed)
Take the Augmentin twice daily with food for 10 days for treatment of your URI.  ? ?Use the Atrovent nasal spray, 2 squirts in each nostril every 6 hours, as needed for runny nose and postnasal drip. ? ?Use the Tessalon Perles every 8 hours during the day.  Take them with a small sip of water.  They may give you some numbness to the base of your tongue or a metallic taste in your mouth, this is normal. ? ?Use the Promethazine DM cough syrup at bedtime for cough and congestion.  It will make you drowsy so do not take it during the day. ? ?Return for reevaluation or see your primary care provider for any new or worsening symptoms.  ?

## 2021-03-16 DIAGNOSIS — Z6832 Body mass index (BMI) 32.0-32.9, adult: Secondary | ICD-10-CM | POA: Diagnosis not present

## 2021-03-16 DIAGNOSIS — B079 Viral wart, unspecified: Secondary | ICD-10-CM | POA: Diagnosis not present

## 2021-03-28 ENCOUNTER — Encounter: Payer: Self-pay | Admitting: Obstetrics and Gynecology

## 2021-03-28 ENCOUNTER — Ambulatory Visit (INDEPENDENT_AMBULATORY_CARE_PROVIDER_SITE_OTHER): Payer: BC Managed Care – PPO | Admitting: Obstetrics and Gynecology

## 2021-03-28 ENCOUNTER — Other Ambulatory Visit: Payer: Self-pay

## 2021-03-28 VITALS — BP 108/70 | Ht 60.0 in | Wt 168.0 lb

## 2021-03-28 DIAGNOSIS — N9089 Other specified noninflammatory disorders of vulva and perineum: Secondary | ICD-10-CM | POA: Diagnosis not present

## 2021-03-28 DIAGNOSIS — R350 Frequency of micturition: Secondary | ICD-10-CM

## 2021-03-28 LAB — POCT URINALYSIS DIPSTICK
Bilirubin, UA: NEGATIVE
Blood, UA: NEGATIVE
Glucose, UA: NEGATIVE
Ketones, UA: NEGATIVE
Leukocytes, UA: NEGATIVE
Nitrite, UA: NEGATIVE
Protein, UA: NEGATIVE
Spec Grav, UA: 1.025 (ref 1.010–1.025)
pH, UA: 5 (ref 5.0–8.0)

## 2021-03-28 NOTE — Progress Notes (Signed)
? ? ?[facility] ? ? ?Chief Complaint  ?Patient presents with  ? Vaginal Exam  ?  External lump, hard, not painful x 3-4 months  ? Urinary Tract Infection  ?  Frequency, lower back pain, no burning x weeks  ? ? ?HPI: ?     [name] is a 21 y.o. G0P0000 whose LMP was Patient's last menstrual period was 03/13/2021 (approximate)., presents today for urinary frequency with good flow, no hematuria, no dysuria. Pt has noticed a urine order intermittently for the past few months. Drinks some caffeine, usually water.  ?Also notes a hard spot on RT labia for a few months. Is getting smaller now. Not tender, no d/c. Pt thought was an ingrown hair but never resolved.  ?She is not sex active. Neg pap 5/22.  ? ?Patient Active Problem List  ? Diagnosis Date Noted  ? Intractable migraine with aura without status migrainosus 02/11/2020  ? Heat syncope 10/07/2018  ? ? ?Past Surgical History:  ?Procedure Laterality Date  ? TONSILLECTOMY N/A 09/13/2017  ? Procedure: TONSILLECTOMY;  Surgeon: Vernie Murders, MD;  Location: [facility];  Service: ENT;  Laterality: N/A;  NO ADENOIDS PER MD  ? WISDOM TOOTH EXTRACTION    ? ? ?Family History  ?Problem Relation Age of Onset  ? Cystic fibrosis Mother 81  ? ? ?Social History  ? ?Socioeconomic History  ? Marital status: Single  ?  Spouse name: Not on file  ? Number of children: Not on file  ? Years of education: Not on file  ? Highest education level: Not on file  ?Occupational History  ? Not on file  ?Tobacco Use  ? Smoking status: Never  ? Smokeless tobacco: Never  ?Vaping Use  ? Vaping Use: Every day  ? Substances: Nicotine, Flavoring  ?Substance and Sexual Activity  ? Alcohol use: No  ? Drug use: No  ? Sexual activity: Not Currently  ?  Birth control/protection: Pill  ?Other Topics Concern  ? Not on file  ?Social History Narrative  ? Not on file  ? ?Social Determinants of Health  ? ?Financial Resource Strain: Not on file  ?Food Insecurity: Not on file   ?Transportation Needs: Not on file  ?Physical Activity: Not on file  ?Stress: Not on file  ?Social Connections: Not on file  ?Intimate Partner Violence: Not on file  ? ? ?Outpatient Medications Prior to Visit  ?Medication Sig Dispense Refill  ? dicyclomine (BENTYL) 10 MG capsule TAKE 1 CAPSULE BY MOUTH THREE TIMES DAILY AS NEEDED FOR STOMACH    ? ipratropium (ATROVENT) 0.06 % nasal spray Place 2 sprays into both nostrils 4 (four) times daily. 15 mL 12  ? norethindrone (MICRONOR) 0.35 MG tablet Take 1 tablet (0.35 mg total) by mouth daily. 84 tablet 3  ? topiramate (TOPAMAX) 25 MG tablet Take by mouth.    ? UBRELVY 50 MG TABS SMARTSIG:50 Milligram(s) By Mouth As Directed    ? benzonatate (TESSALON) 100 MG capsule Take 2 capsules (200 mg total) by mouth every 8 (eight) hours. 21 capsule 0  ? promethazine-dextromethorphan (PROMETHAZINE-DM) 6.25-15 MG/5ML syrup Take 5 mLs by mouth 4 (four) times daily as needed. 118 mL 0  ? ?No facility-administered medications prior to visit.  ? ? ? ? ?ROS: ? ?Review of Systems  ?Constitutional:  Negative for fever.  ?Gastrointestinal:  Negative for blood in stool, constipation, diarrhea, nausea and vomiting.  ?Genitourinary:  Positive for frequency and genital sores. Negative for dyspareunia, dysuria,  flank pain, hematuria, urgency, vaginal bleeding, vaginal discharge and vaginal pain.  ?Musculoskeletal:  Negative for back pain.  ?Skin:  Negative for rash.  ?BREAST: No symptoms ? ? ?OBJECTIVE:  ? ?Vitals:  ?BP 108/70   Ht 5' (1.524 m)   Wt 168 lb (76.2 kg)   LMP 03/13/2021 (Approximate)   BMI 32.81 kg/m?  ? ?Physical Exam ?Vitals reviewed.  ?Constitutional:   ?   Appearance: She is well-developed.  ?Pulmonary:  ?   Effort: Pulmonary effort is normal.  ?Genitourinary: ?   Pubic Area: No rash.   ?   Labia:     ?   Right: Lesion present. No rash or tenderness.     ?   Left: No rash, tenderness or lesion.   ? ? ?Musculoskeletal:     ?   General: Normal range of motion.  ?   Cervical  back: Normal range of motion.  ?Skin: ?   General: Skin is warm and dry.  ?Neurological:  ?   General: No focal deficit present.  ?   Mental Status: She is alert and oriented to person, place, and time.  ?Psychiatric:     ?   Mood and Affect: Mood normal.     ?   Behavior: Behavior normal.     ?   Thought Content: Thought content normal.     ?   Judgment: Judgment normal.  ? ? ?Results: ?Results for orders placed or performed in visit on 03/28/21 (from the past 24 hour(s))  ?POCT Urinalysis Dipstick     Status: Normal  ? Collection Time: 03/28/21  3:46 PM  ?Result Value Ref Range  ? Color, UA amber   ? Clarity, UA clear   ? Glucose, UA Negative Negative  ? Bilirubin, UA neg   ? Ketones, UA neg   ? Spec Grav, UA 1.025 1.010 - 1.025  ? Blood, UA neg   ? pH, UA 5.0 5.0 - 8.0  ? Protein, UA Negative Negative  ? Urobilinogen, UA    ? Nitrite, UA neg   ? Leukocytes, UA Negative Negative  ? Appearance    ? Odor    ? ? ? ?Assessment/Plan: ?Urine frequency - Plan: POCT Urinalysis Dipstick; neg UA, no other sx except odor and frequency with good flow. Reassurance. Increase water. F/u prn ? ?Vulvar lesion--resolving lesion; most likely folliculitis vs "pimple". Warm compresses. Reassurance. F/u prn.  ? ? ? Return if symptoms worsen or fail to improve. ? ?Javien Tesch B. Jahnaya Branscome, PA-C ?03/28/2021 ?3:47 PM ? ? ? ? ? ?

## 2021-04-30 ENCOUNTER — Ambulatory Visit: Payer: Self-pay

## 2021-04-30 ENCOUNTER — Ambulatory Visit
Admission: EM | Admit: 2021-04-30 | Discharge: 2021-04-30 | Disposition: A | Discharge status: Home / Self Care | Payer: BC Managed Care – PPO | Attending: Physician Assistant | Admitting: Physician Assistant

## 2021-04-30 DIAGNOSIS — H1031 Unspecified acute conjunctivitis, right eye: Secondary | ICD-10-CM

## 2021-04-30 DIAGNOSIS — J329 Chronic sinusitis, unspecified: Secondary | ICD-10-CM

## 2021-04-30 MED ORDER — ERYTHROMYCIN 5 MG/GM OP OINT: TOPICAL_OINTMENT | OPHTHALMIC | 0 refills | Status: DC

## 2021-04-30 MED ORDER — CEFDINIR 300 MG PO CAPS: 300.0000 mg | ORAL_CAPSULE | Freq: Two times a day (BID) | ORAL | 0 refills | Status: DC

## 2021-04-30 MED ORDER — FLUTICASONE PROPIONATE 50 MCG/ACT NA SUSP
1.0000 | Freq: Every day | NASAL | 0 refills | Status: DC
Start: 2021-04-30 — End: 2023-01-19

## 2021-04-30 NOTE — ED Triage Notes (Signed)
Patient is here for "Cough". Started "very recently". Runny nose "for about a week". 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 ? Recurrent sinus infection (per patient). No fever. ?

## 2021-04-30 NOTE — Discharge Instructions (Signed)
We are treating for sinus infection.  Take cefdinir twice daily.  Continue using Mucinex and add Flonase to your medication regimen.  You can also use an allergy medication for additional symptom relief.  I do think that given your recurrent symptoms you should follow-up with ENT.  Call to schedule an appointment.  Use eye ointment nightly for the next 7 days.  If anything worsens and you develop chest pain, shortness of breath, nausea/vomiting interfere with oral intake, fever you should be seen immediately. ?

## 2021-04-30 NOTE — ED Provider Notes (Signed)
?[facility] ? ? ? ?CSN: [id number] ?Arrival date & time: 04/30/21  [id number] ? ? ?  ? ?History   ?Chief Complaint ?Chief Complaint  ?Patient presents with  ? Cough  ?  Appt  ? ? ?HPI ?Robyn Mcmahon is a 21 y.o. female.  ? ?Patient presents today with a week plus long history of sinus congestion and cough.  Reports that symptoms began as a typical virus and congestion is gotten worse and harder to get rid of.  She has tried over-the-counter medication including Mucinex with improvement but not resolution of symptoms.  She denies formal diagnosis of allergies but wonders if she could be getting this.  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  Her last antibiotic use was augmented 03/17/2021.  She is also concerned that she might be getting conjunctivitis as several of the kids of had pinkeye and she had increased crusting of her right eye when she woke up this morning.  She does not wear contacts.  She denies any fever, chest pain, shortness of breath, nausea, vomiting.  She denies any history of asthma, allergies, COPD.  She does vape but has not been vaping since symptoms began.  She is confident she is not pregnant. ? ? ?Past Medical History:  ?Diagnosis Date  ? Chronic tonsillitis   ? Irritable bowel syndrome   ? Migraine with aura   ? every other day  ? Syncope   ? ? ?Patient Active Problem List  ? Diagnosis Date Noted  ? Irritable bowel syndrome 11/24/2020  ? Intractable migraine with aura without status migrainosus 02/11/2020  ? Migraine without aura and without status migrainosus, not intractable 11/21/2019  ? Heat syncope 10/07/2018  ? ? ?Past Surgical History:  ?Procedure Laterality Date  ? TONSILLECTOMY N/A 09/13/2017  ? Procedure: TONSILLECTOMY;  Surgeon: Vernie MurdersJuengel, Paul, MD;  [facility];  Service: ENT;  Laterality: N/A;  NO ADENOIDS PER MD  ? WISDOM TOOTH  EXTRACTION    ? ? ?OB History   ? ? Gravida  ?0  ? Para  ?0  ? Term  ?0  ? Preterm  ?0  ? AB  ?0  ? Living  ?0  ?  ? ? SAB  ?0  ? IAB  ?0  ? Ectopic  ?0  ? Multiple  ?0  ? Live Births  ?0  ?   ?  ?  ? ? ? ?Home Medications   ? ?Prior to Admission medications   ?Medication Sig Start Date End Date Taking? Authorizing Provider  ?cefdinir (OMNICEF) 300 MG capsule Take 1 capsule (300 mg total) by mouth 2 (two) times daily. 04/30/21  Yes Tamkia Temples, Denny PeonErin K, PA-C  ?dicyclomine (BENTYL) 10 MG capsule TAKE 1 CAPSULE BY MOUTH THREE TIMES DAILY AS NEEDED FOR STOMACH 08/08/18  Yes [provider]  ?erythromycin ophthalmic ointment Place a 1/2 inch ribbon of ointment into the lower eyelid of right eye daily for one week 04/30/21  Yes Kersti Scavone K, PA-C  ?fluticasone (FLONASE) 50 MCG/ACT nasal spray Place 1 spray into both nostrils daily. 04/30/21  Yes Kadra Kohan, Denny PeonErin K, PA-C  ?norethindrone (MICRONOR) 0.35 MG tablet Take 1 tablet (0.35 mg total) by mouth daily. 05/10/20  Yes Copland, Ilona SorrelAlicia B, PA-C  ?topiramate (TOPAMAX) 25 MG tablet Take by mouth. 12/05/19  Yes [provider]  ?UBRELVY 50 MG TABS  SMARTSIG:50 Milligram(s) By Mouth As Directed 01/11/20  Yes [provider]  ?ipratropium (ATROVENT) 0.06 % nasal spray Place 2 sprays into both nostrils 4 (four) times daily. 03/14/21   Becky Augusta, NP  ? ? ?Family History ?Family History  ?Problem Relation Age of Onset  ? Cystic fibrosis Mother 57  ? ? ?Social History ?Social History  ? ?Tobacco Use  ? Smoking status: Never  ? Smokeless tobacco: Never  ?Vaping Use  ? Vaping Use: Every day  ? Substances: Nicotine, Flavoring  ?Substance Use Topics  ? Alcohol use: No  ? Drug use: No  ? ? ? ?Allergies   ?Patient has no known allergies. ? ? ?Review of Systems ?Review of Systems  ?Constitutional:  Positive for activity change. Negative for appetite change, fatigue and fever.  ?HENT:  Positive for congestion, postnasal drip, sinus pressure and sore throat. Negative for  sneezing.   ?Eyes:  Positive for discharge. Negative for photophobia, pain, redness, itching and visual disturbance.  ?Respiratory:  Positive for cough. Negative for shortness of breath.   ?Cardiovascular:  Negative for chest pain.  ?Gastrointestinal:  Negative for abdominal pain, diarrhea, nausea and vomiting.  ?Neurological:  Positive for headaches. Negative for dizziness and light-headedness.  ? ? ?Physical Exam ?Triage Vital Signs ?ED Triage Vitals  ?Enc Vitals Group  ?   BP 04/30/21 0948 111/78  ?   Pulse Rate 04/30/21 0948 90  ?   Resp 04/30/21 0948 16  ?   Temp 04/30/21 0948 98.3 ?F (36.8 ?C)  ?   Temp Source 04/30/21 0948 Oral  ?   SpO2 04/30/21 0948 99 %  ?   Weight 04/30/21 0946 167 lb 15.9 oz (76.2 kg)  ?   Height 04/30/21 0946 5' (1.524 m)  ?   Head Circumference --   ?   Peak Flow --   ?   Pain Score 04/30/21 0945 0  ?   Pain Loc --   ?   Pain Edu? --   ?   Excl. in GC? --   ? ?No data found. ? ?Updated Vital Signs ?BP 111/78 (BP Location: Left Arm)   Pulse 90   Temp 98.3 ?F (36.8 ?C) (Oral)   Resp 16   Ht 5' (1.524 m)   Wt 167 lb 15.9 oz (76.2 kg)   LMP  (LMP Unknown)   SpO2 99%   BMI 32.81 kg/m?  ? ?Visual Acuity ?Right Eye Distance:   ?Left Eye Distance:   ?Bilateral Distance:   ? ?Right Eye Near:   ?Left Eye Near:    ?Bilateral Near:    ? ?Physical Exam ?Vitals reviewed.  ?Constitutional:   ?   General: She is awake. She is not in acute distress. ?   Appearance: Normal appearance. She is well-developed. She is not ill-appearing.  ?   Comments: Very pleasant female appears stated age in no acute distress sitting comfortably on exam room table  ?HENT:  ?   Head: Normocephalic and atraumatic.  ?   Right Ear: Tympanic membrane, ear canal and external ear normal. There is impacted cerumen. Tympanic membrane is not erythematous or bulging.  ?   Left Ear: Tympanic membrane, ear canal and external ear normal. Tympanic membrane is not erythematous or bulging.  ?   Nose:  ?   Right Sinus: Maxillary  sinus tenderness present. No frontal sinus tenderness.  ?   Left Sinus: Maxillary sinus tenderness present. No frontal sinus tenderness.  ?   Mouth/Throat:  ?  Pharynx: Uvula midline. Posterior oropharyngeal erythema present. No oropharyngeal exudate.  ?   Comments: Erythema and drainage in posterior oropharynx ?Cardiovascular:  ?   Rate and Rhythm: Normal rate and regular rhythm.  ?   Heart sounds: Normal heart sounds, S1 normal and S2 normal. No murmur heard. ?Pulmonary:  ?   Effort: Pulmonary effort is normal.  ?   Breath sounds: Normal breath sounds. No wheezing, rhonchi or rales.  ?   Comments: Clear to auscultation bilaterally ?Psychiatric:     ?   Behavior: Behavior is cooperative.  ? ? ? ?UC Treatments / Results  ?Labs ?(all labs ordered are listed, but only abnormal results are displayed) ?Labs Reviewed - No data to display ? ?EKG ? ? ?Radiology ?No results found. ? ?Procedures ?Procedures (including critical care time) ? ?Medications Ordered in UC ?Medications - No data to display ? ?Initial Impression / Assessment and Plan / UC Course  ?I have reviewed the triage vital signs and the nursing notes. ? ?Pertinent labs & imaging results that were available during my care of the patient were reviewed by me and considered in my medical decision making (see chart for details). ? ?  ? ?Concern for secondary bacterial infection given prolonged and worsening symptoms.  No indication for viral testing as patient has been symptomatic for more than a week and this would not change management.  Discussed that she should use daily allergy medication to help minimize symptoms in case allergies are contributing to symptoms.  She was encouraged to use Mucinex and Flonase for additional symptom relief.  Discussed that if symptoms persist she should follow-up with ENT given recurrent symptoms and was given contact information for local provider with instruction to call to schedule an appointment.  We will start Omnicef  twice daily.  Will cover for bacterial conjunctivitis given known exposure with increased drainage overnight with erythromycin.  Discussed that if she has persistent symptoms she should follow-up next week with either our cli

## 2021-05-04 DIAGNOSIS — H6121 Impacted cerumen, right ear: Secondary | ICD-10-CM | POA: Diagnosis not present

## 2021-05-04 DIAGNOSIS — H9202 Otalgia, left ear: Secondary | ICD-10-CM | POA: Diagnosis not present

## 2021-05-04 DIAGNOSIS — Z6831 Body mass index (BMI) 31.0-31.9, adult: Secondary | ICD-10-CM | POA: Diagnosis not present

## 2021-05-24 ENCOUNTER — Other Ambulatory Visit: Payer: Self-pay | Admitting: Obstetrics and Gynecology

## 2021-05-24 DIAGNOSIS — Z3041 Encounter for surveillance of contraceptive pills: Secondary | ICD-10-CM

## 2021-05-26 MED ORDER — NORETHINDRONE 0.35 MG PO TABS: 1.0000 | ORAL_TABLET | Freq: Every day | ORAL | 0 refills | Status: DC

## 2021-05-26 NOTE — Telephone Encounter (Signed)
Spoke w/patient. Advised refill request was denied due to her being past due for annual. She is now scheduled for annual 06/30/21. Advised 1 refill will be sent.

## 2021-05-26 NOTE — Addendum Note (Signed)
Addended by: Kathlene Cote on: 05/26/2021 03:51 PM   Modules accepted: Orders

## 2021-05-26 NOTE — Telephone Encounter (Signed)
Triage Voicemail: Patient unable to request refill thru my chart. Her pharmacy hasn't gotten back to her about her request. Requesting refill of birth control. WF#[phone]

## 2021-06-30 ENCOUNTER — Encounter: Payer: Self-pay | Admitting: Obstetrics and Gynecology

## 2021-06-30 ENCOUNTER — Ambulatory Visit (INDEPENDENT_AMBULATORY_CARE_PROVIDER_SITE_OTHER): Payer: BC Managed Care – PPO | Admitting: Obstetrics and Gynecology

## 2021-06-30 VITALS — BP 118/60 | Ht 60.0 in | Wt 168.0 lb

## 2021-06-30 DIAGNOSIS — Z01419 Encounter for gynecological examination (general) (routine) without abnormal findings: Secondary | ICD-10-CM

## 2021-06-30 DIAGNOSIS — Z3041 Encounter for surveillance of contraceptive pills: Secondary | ICD-10-CM

## 2021-06-30 MED ORDER — NORETHINDRONE 0.35 MG PO TABS: 1.0000 | ORAL_TABLET | Freq: Every day | ORAL | 3 refills | Status: DC

## 2021-06-30 NOTE — Patient Instructions (Signed)
I value your feedback and you entrusting us with your care. If you get a [facility] patient survey, I would appreciate you taking the time to let us know about your experience today. Thank you! ? ? ?

## 2021-06-30 NOTE — Progress Notes (Signed)
PCP:  [facility]   Chief Complaint  Patient presents with   Gynecologic Exam    HPI:      [name] is a 21 y.o. G0P0000 whose LMP was Patient's last menstrual period was 06/28/2021 (exact date)., presents today for her annual examination.  Her menses are monthly on POPs, lasting 7 days, mod flow, no BTB, no dysmen. Migraines manageable. Periods worse with depo in past. Hx of menometrorrhagia. Hx of migraines with aura.   Sex activity: not sexually active currently. Was one time before 11/22; had neg STD testing since.  Last Pap: 05/13/53 Results were negative Hx of STDs: none  There is no FH of breast cancer. There is no FH of ovarian cancer. The patient does not do self-breast exams.  Tobacco use: The patient denies current or previous tobacco use. Alcohol use: none No drug use.  Exercise: moderately active  She does get adequate calcium and Vitamin D in her diet.   Unsure if Gardasil done  Past Medical History:  Diagnosis Date   Chronic tonsillitis    Irritable bowel syndrome    Migraine with aura    every other day   Syncope     Past Surgical History:  Procedure Laterality Date   TONSILLECTOMY N/A 09/13/2017   Procedure: TONSILLECTOMY;  Surgeon: Vernie Murders, MD;  Location: [facility];  Service: ENT;  Laterality: N/A;  NO ADENOIDS PER MD   WISDOM TOOTH EXTRACTION      Family History  Problem Relation Age of Onset   Cystic fibrosis Mother 30    Social History   Socioeconomic History   Marital status: Single    Spouse name: Not on file   Number of children: Not on file   Years of education: Not on file   Highest education level: Not on file  Occupational History   Not on file  Tobacco Use   Smoking status: Never   Smokeless tobacco: Never  Vaping Use   Vaping Use: Every day   Substances: Nicotine, Flavoring  Substance and Sexual Activity   Alcohol use: No   Drug use: No   Sexual activity: Not Currently    Birth  control/protection: Pill  Other Topics Concern   Not on file  Social History Narrative   Not on file   Social Determinants of Health   Financial Resource Strain: Not on file  Food Insecurity: Not on file  Transportation Needs: Not on file  Physical Activity: Not on file  Stress: Not on file  Social Connections: Not on file  Intimate Partner Violence: Not on file     Current Outpatient Medications:    dicyclomine (BENTYL) 10 MG capsule, TAKE 1 CAPSULE BY MOUTH THREE TIMES DAILY AS NEEDED FOR STOMACH, Disp: , Rfl:    topiramate (TOPAMAX) 25 MG tablet, Take by mouth., Disp: , Rfl:    cefdinir (OMNICEF) 300 MG capsule, Take 1 capsule (300 mg total) by mouth 2 (two) times daily., Disp: 14 capsule, Rfl: 0   erythromycin ophthalmic ointment, Place a 1/2 inch ribbon of ointment into the lower eyelid of right eye daily for one week, Disp: 3.5 g, Rfl: 0   fluticasone (FLONASE) 50 MCG/ACT nasal spray, Place 1 spray into both nostrils daily., Disp: 16 g, Rfl: 0   ipratropium (ATROVENT) 0.06 % nasal spray, Place 2 sprays into both nostrils 4 (four) times daily., Disp: 15 mL, Rfl: 12   norethindrone (MICRONOR) 0.35 MG tablet, Take 1 tablet (0.35  mg total) by mouth daily., Disp: 84 tablet, Rfl: 3   UBRELVY 50 MG TABS, SMARTSIG:50 Milligram(s) By Mouth As Directed (Patient not taking: Reported on 06/30/2021), Disp: , Rfl:     ROS:  Review of Systems  Constitutional:  Negative for fatigue, fever and unexpected weight change.  Respiratory:  Negative for cough, shortness of breath and wheezing.   Cardiovascular:  Negative for chest pain, palpitations and leg swelling.  Gastrointestinal:  Negative for blood in stool, constipation, diarrhea, nausea and vomiting.  Endocrine: Negative for cold intolerance, heat intolerance and polyuria.  Genitourinary:  Negative for dyspareunia, dysuria, flank pain, frequency, genital sores, hematuria, menstrual problem, pelvic pain, urgency, vaginal bleeding, vaginal  discharge and vaginal pain.  Musculoskeletal:  Negative for back pain, joint swelling and myalgias.  Skin:  Negative for rash.  Neurological:  Negative for dizziness, syncope, light-headedness, numbness and headaches.  Hematological:  Negative for adenopathy.  Psychiatric/Behavioral:  Negative for agitation, confusion, sleep disturbance and suicidal ideas. The patient is not nervous/anxious.   BREAST: No symptoms   Objective: BP 118/60   Ht 5' (1.524 m)   Wt 168 lb (76.2 kg)   LMP 06/28/2021 (Exact Date)   BMI 32.81 kg/m    Physical Exam Constitutional:      Appearance: She is well-developed.  Genitourinary:     Vulva normal.     Right Labia: No rash, tenderness or lesions.    Left Labia: No tenderness, lesions or rash.    No vaginal discharge, erythema or tenderness.      Right Adnexa: not tender and no mass present.    Left Adnexa: not tender and no mass present.    No cervical friability or polyp.     Uterus is not enlarged or tender.  Breasts:    Right: No mass, nipple discharge, skin change or tenderness.     Left: No mass, nipple discharge, skin change or tenderness.  Neck:     Thyroid: No thyromegaly.  Cardiovascular:     Rate and Rhythm: Normal rate and regular rhythm.     Heart sounds: Normal heart sounds. No murmur heard. Pulmonary:     Effort: Pulmonary effort is normal.     Breath sounds: Normal breath sounds.  Abdominal:     Palpations: Abdomen is soft.     Tenderness: There is no abdominal tenderness. There is no guarding or rebound.  Musculoskeletal:        General: Normal range of motion.     Cervical back: Normal range of motion.  Lymphadenopathy:     Cervical: No cervical adenopathy.  Neurological:     General: No focal deficit present.     Mental Status: She is alert and oriented to person, place, and time.     Cranial Nerves: No cranial nerve deficit.  Skin:    General: Skin is warm and dry.  Psychiatric:        Mood and Affect: Mood  normal.        Behavior: Behavior normal.        Thought Content: Thought content normal.        Judgment: Judgment normal.  Vitals reviewed.     Assessment/Plan: Encounter for annual routine gynecological examination  Encounter for surveillance of contraceptive pills - Plan: norethindrone (MICRONOR) 0.35 MG tablet; Rx RF. Doing well.   Meds ordered this encounter  Medications   norethindrone (MICRONOR) 0.35 MG tablet    Sig: Take 1 tablet (0.35 mg total) by mouth daily.  Dispense:  84 tablet    Refill:  3    Order Specific Question:   Supervising Provider    Answer:   Waymon Budge             GYN counsel adequate intake of calcium and vitamin D, diet and exercise     F/U  Return in about 1 year (around 07/01/2022).  Gladiola Madore B. Jahseh Lucchese, PA-C 06/30/2021 4:08 PM

## 2022-05-16 ENCOUNTER — Other Ambulatory Visit: Payer: Self-pay | Admitting: Obstetrics and Gynecology

## 2022-05-16 ENCOUNTER — Telehealth: Payer: Self-pay | Admitting: Obstetrics and Gynecology

## 2022-05-16 ENCOUNTER — Ambulatory Visit: Payer: BC Managed Care – PPO | Admitting: Obstetrics and Gynecology

## 2022-05-16 DIAGNOSIS — R102 Pelvic and perineal pain: Secondary | ICD-10-CM

## 2022-05-16 NOTE — Telephone Encounter (Signed)
I contacted the patient for ultrasound and follow up with Alicia Copland. First opening, I have the patient scheduled for 5/17 for ultrasound and Monday, 5/20 at 9:35 am with Helmut Muster Copland. I left voicemail for the patient to call back to confirm.

## 2022-05-16 NOTE — Progress Notes (Signed)
Pt with hx of pelvic pain. Saw PCP who wanted xray and GYN u/s. Pt doesn't want u/s at hospital so scheduled appt here for exam. Didn't show for appt. Will place GYN u/s order for here and pt to f/u with me afterwards

## 2022-05-16 NOTE — Progress Notes (Deleted)
Pa, [facility]   No chief complaint on file.   HPI:      [name] is a 22 y.o. G0P0000 whose LMP was No LMP recorded. (Menstrual status: Oral contraceptives)., presents today for pelvic pain, GYN u/s recommended by PCP. Pt doesn't want to do at hospital.     Patient Active Problem List   Diagnosis Date Noted   Irritable bowel syndrome 11/24/2020   Intractable migraine with aura without status migrainosus 02/11/2020   Migraine without aura and without status migrainosus, not intractable 11/21/2019   Heat syncope 10/07/2018    Past Surgical History:  Procedure Laterality Date   TONSILLECTOMY N/A 09/13/2017   Procedure: TONSILLECTOMY;  Surgeon: Vernie Murders, MD;  Location: [facility];  Service: ENT;  Laterality: N/A;  NO ADENOIDS PER MD   WISDOM TOOTH EXTRACTION      Family History  Problem Relation Age of Onset   Cystic fibrosis Mother 27    Social History   Socioeconomic History   Marital status: Single    Spouse name: Not on file   Number of children: Not on file   Years of education: Not on file   Highest education level: Not on file  Occupational History   Not on file  Tobacco Use   Smoking status: Never   Smokeless tobacco: Never  Vaping Use   Vaping Use: Every day   Substances: Nicotine, Flavoring  Substance and Sexual Activity   Alcohol use: No   Drug use: No   Sexual activity: Not Currently    Birth control/protection: Pill  Other Topics Concern   Not on file  Social History Narrative   Not on file   Social Determinants of Health   Financial Resource Strain: Not on file  Food Insecurity: Not on file  Transportation Needs: Not on file  Physical Activity: Not on file  Stress: Not on file  Social Connections: Not on file  Intimate Partner Violence: Not on file    Outpatient Medications Prior to Visit  Medication Sig Dispense Refill   cefdinir (OMNICEF) 300 MG capsule Take 1 capsule (300 mg total) by mouth  2 (two) times daily. 14 capsule 0   dicyclomine (BENTYL) 10 MG capsule TAKE 1 CAPSULE BY MOUTH THREE TIMES DAILY AS NEEDED FOR STOMACH     erythromycin ophthalmic ointment Place a 1/2 inch ribbon of ointment into the lower eyelid of right eye daily for one week 3.5 g 0   fluticasone (FLONASE) 50 MCG/ACT nasal spray Place 1 spray into both nostrils daily. 16 g 0   ipratropium (ATROVENT) 0.06 % nasal spray Place 2 sprays into both nostrils 4 (four) times daily. 15 mL 12   norethindrone (MICRONOR) 0.35 MG tablet Take 1 tablet (0.35 mg total) by mouth daily. 84 tablet 3   topiramate (TOPAMAX) 25 MG tablet Take by mouth.     UBRELVY 50 MG TABS SMARTSIG:50 Milligram(s) By Mouth As Directed (Patient not taking: Reported on 06/30/2021)     No facility-administered medications prior to visit.      ROS:  Review of Systems BREAST: No symptoms   OBJECTIVE:   Vitals:  There were no vitals taken for this visit.  Physical Exam  Results: No results found for this or any previous visit (from the past 24 hour(s)).   Assessment/Plan: No diagnosis found.    No orders of the defined types were placed in this encounter.     No follow-ups on file.  Helmut Muster  B. Laniah Grimm, PA-C 05/16/2022 10:59 AM

## 2022-05-26 ENCOUNTER — Other Ambulatory Visit: Payer: BC Managed Care – PPO

## 2022-05-29 ENCOUNTER — Ambulatory Visit: Payer: BC Managed Care – PPO | Admitting: Obstetrics and Gynecology

## 2022-07-13 NOTE — Progress Notes (Deleted)
PCP:  Pa,  [facility]   No chief complaint on file.   HPI:      [name] is a 22 y.o. G0P0000 whose LMP was No LMP recorded. (Menstrual status: Oral contraceptives)., presents today for her annual examination.  Her menses are monthly on POPs, lasting 7 days, mod flow, no BTB, no dysmen. Migraines manageable. Periods worse with depo in past. Hx of menometrorrhagia. Hx of migraines with aura.   Sex activity: not sexually active currently. Was one time before 11/22; had neg STD testing since.  Last Pap: 05/10/20 Results were negative Hx of STDs: none  There is no FH of breast cancer. There is no FH of ovarian cancer. The patient does not do self-breast exams.  Tobacco use: The patient denies current or previous tobacco use. Alcohol use: none No drug use.  Exercise: moderately active  She does get adequate calcium and Vitamin D in her diet.   Unsure if Gardasil done  Past Medical History:  Diagnosis Date   Chronic tonsillitis    Irritable bowel syndrome    Migraine with aura    every other day   Syncope     Past Surgical History:  Procedure Laterality Date   TONSILLECTOMY N/A 09/13/2017   Procedure: TONSILLECTOMY;  Surgeon: Vernie Murders, MD;  Location: [facility];  Service: ENT;  Laterality: N/A;  NO ADENOIDS PER MD   WISDOM TOOTH EXTRACTION      Family History  Problem Relation Age of Onset   Cystic fibrosis Mother 12    Social History   Socioeconomic History   Marital status: Single    Spouse name: Not on file   Number of children: Not on file   Years of education: Not on file   Highest education level: Not on file  Occupational History   Not on file  Tobacco Use   Smoking status: Never   Smokeless tobacco: Never  Vaping Use   Vaping Use: Every day   Substances: Nicotine, Flavoring  Substance and Sexual Activity   Alcohol use: No   Drug use: No   Sexual activity: Not Currently    Birth control/protection: Pill  Other  Topics Concern   Not on file  Social History Narrative   Not on file   Social Determinants of Health   Financial Resource Strain: Not on file  Food Insecurity: Not on file  Transportation Needs: Not on file  Physical Activity: Not on file  Stress: Not on file  Social Connections: Not on file  Intimate Partner Violence: Not on file     Current Outpatient Medications:    cefdinir (OMNICEF) 300 MG capsule, Take 1 capsule (300 mg total) by mouth 2 (two) times daily., Disp: 14 capsule, Rfl: 0   dicyclomine (BENTYL) 10 MG capsule, TAKE 1 CAPSULE BY MOUTH THREE TIMES DAILY AS NEEDED FOR STOMACH, Disp: , Rfl:    erythromycin ophthalmic ointment, Place a 1/2 inch ribbon of ointment into the lower eyelid of right eye daily for one week, Disp: 3.5 g, Rfl: 0   fluticasone (FLONASE) 50 MCG/ACT nasal spray, Place 1 spray into both nostrils daily., Disp: 16 g, Rfl: 0   ipratropium (ATROVENT) 0.06 % nasal spray, Place 2 sprays into both nostrils 4 (four) times daily., Disp: 15 mL, Rfl: 12   norethindrone (MICRONOR) 0.35 MG tablet, Take 1 tablet (0.35 mg total) by mouth daily., Disp: 84 tablet, Rfl: 3   topiramate (TOPAMAX) 25 MG tablet, Take by mouth., Disp: ,  Rfl:    UBRELVY 50 MG TABS, SMARTSIG:50 Milligram(s) By Mouth As Directed (Patient not taking: Reported on 06/30/2021), Disp: , Rfl:     ROS:  Review of Systems  Constitutional:  Negative for fatigue, fever and unexpected weight change.  Respiratory:  Negative for cough, shortness of breath and wheezing.   Cardiovascular:  Negative for chest pain, palpitations and leg swelling.  Gastrointestinal:  Negative for blood in stool, constipation, diarrhea, nausea and vomiting.  Endocrine: Negative for cold intolerance, heat intolerance and polyuria.  Genitourinary:  Negative for dyspareunia, dysuria, flank pain, frequency, genital sores, hematuria, menstrual problem, pelvic pain, urgency, vaginal bleeding, vaginal discharge and vaginal pain.   Musculoskeletal:  Negative for back pain, joint swelling and myalgias.  Skin:  Negative for rash.  Neurological:  Negative for dizziness, syncope, light-headedness, numbness and headaches.  Hematological:  Negative for adenopathy.  Psychiatric/Behavioral:  Negative for agitation, confusion, sleep disturbance and suicidal ideas. The patient is not nervous/anxious.   BREAST: No symptoms   Objective: There were no vitals taken for this visit.   Physical Exam Constitutional:      Appearance: She is well-developed.  Genitourinary:     Vulva normal.     Right Labia: No rash, tenderness or lesions.    Left Labia: No tenderness, lesions or rash.    No vaginal discharge, erythema or tenderness.      Right Adnexa: not tender and no mass present.    Left Adnexa: not tender and no mass present.    No cervical friability or polyp.     Uterus is not enlarged or tender.  Breasts:    Right: No mass, nipple discharge, skin change or tenderness.     Left: No mass, nipple discharge, skin change or tenderness.  Neck:     Thyroid: No thyromegaly.  Cardiovascular:     Rate and Rhythm: Normal rate and regular rhythm.     Heart sounds: Normal heart sounds. No murmur heard. Pulmonary:     Effort: Pulmonary effort is normal.     Breath sounds: Normal breath sounds.  Abdominal:     Palpations: Abdomen is soft.     Tenderness: There is no abdominal tenderness. There is no guarding or rebound.  Musculoskeletal:        General: Normal range of motion.     Cervical back: Normal range of motion.  Lymphadenopathy:     Cervical: No cervical adenopathy.  Neurological:     General: No focal deficit present.     Mental Status: She is alert and oriented to person, place, and time.     Cranial Nerves: No cranial nerve deficit.  Skin:    General: Skin is warm and dry.  Psychiatric:        Mood and Affect: Mood normal.        Behavior: Behavior normal.        Thought Content: Thought content normal.         Judgment: Judgment normal.  Vitals reviewed.     Assessment/Plan: Encounter for annual routine gynecological examination  Encounter for surveillance of contraceptive pills - Plan: norethindrone (MICRONOR) 0.35 MG tablet; Rx RF. Doing well.   No orders of the defined types were placed in this encounter.            GYN counsel adequate intake of calcium and vitamin D, diet and exercise     F/U  No follow-ups on file.  Rucha Wissinger B. Caresse Sedivy, PA-C 07/13/2022 8:33 PM

## 2022-07-14 ENCOUNTER — Ambulatory Visit: Payer: BC Managed Care – PPO | Admitting: Obstetrics and Gynecology

## 2022-07-14 DIAGNOSIS — Z113 Encounter for screening for infections with a predominantly sexual mode of transmission: Secondary | ICD-10-CM

## 2022-07-14 DIAGNOSIS — Z01419 Encounter for gynecological examination (general) (routine) without abnormal findings: Secondary | ICD-10-CM

## 2022-07-14 DIAGNOSIS — Z3041 Encounter for surveillance of contraceptive pills: Secondary | ICD-10-CM

## 2023-01-18 NOTE — Progress Notes (Signed)
 PCP:  Pa,  [facility]ediatrics   Chief Complaint  Patient presents with   Gynecologic Exam    HPI:      [name] is a 23 y.o. G0P0000 whose LMP was Patient's last menstrual period was 01/05/2023 (exact date)., presents today for her annual examination.  Her menses are monthly on POPs, lasting 7 days, mod flow, no BTB, no dysmen. Stopped POPs for about 6 months but restarted 12/24. No migraine change off POPs. Periods worse with depo in past. Hx of menometrorrhagia. Hx of migraines with aura.   Had significant pelvic pain 5/24 that resolved on its own. No imaging done due to cost. Hx of IBS but didn't feel like that.   Sex activity: not sexually active currently but has been since last annual.  Last Pap: 05/10/20 Results were negative Hx of STDs: none  There is no FH of breast cancer. There is no FH of ovarian cancer. The patient does not do self-breast exams.  Tobacco use: vapes daily Alcohol use: none No drug use.  Exercise: moderately active  She does get adequate calcium and Vitamin D in her diet.   Unsure if Gardasil done  Past Medical History:  Diagnosis Date   Chronic tonsillitis    Irritable bowel syndrome    Migraine with aura    every other day   Syncope     Past Surgical History:  Procedure Laterality Date   TONSILLECTOMY N/A 09/13/2017   Procedure: TONSILLECTOMY;  Surgeon: Edda Mt, MD;  Location: [facility];  Service: ENT;  Laterality: N/A;  NO ADENOIDS PER MD   WISDOM TOOTH EXTRACTION      Family History  Problem Relation Age of Onset   Cystic fibrosis Mother 64   Multiple sclerosis Mother     Social History   Socioeconomic History   Marital status: Single    Spouse name: Not on file   Number of children: Not on file   Years of education: Not on file   Highest education level: Not on file  Occupational History   Not on file  Tobacco Use   Smoking status: Never   Smokeless tobacco: Never  Vaping Use   Vaping  status: Every Day   Substances: Nicotine, Flavoring  Substance and Sexual Activity   Alcohol use: No   Drug use: No   Sexual activity: Not Currently    Birth control/protection: Pill  Other Topics Concern   Not on file  Social History Narrative   Not on file   Social Drivers of Health   Financial Resource Strain: Low Risk  (01/19/2021)   Received from [facility], [facility]   Overall Financial Resource Strain (CARDIA)    Difficulty of Paying Living Expenses: Not hard at all  Food Insecurity: No Food Insecurity (01/19/2021)   Received from [facility], [facility]   Hunger Vital Sign    Worried About Running Out of Food in the Last Year: Never true    Ran Out of Food in the Last Year: Never true  Transportation Needs: No Transportation Needs (01/19/2021)   Received from [facility], [facility]   [facility] - Transportation    Lack of Transportation (Medical): No    Lack of Transportation (Non-Medical): No  Physical Activity: Not on file  Stress: Not on file  Social Connections: Not on file  Intimate Partner Violence: Not on file     Current Outpatient Medications:    norethindrone  (  MICRONOR ) 0.35 MG tablet, Take 1 tablet (0.35 mg total) by mouth daily., Disp: 84 tablet, Rfl: 3    ROS:  Review of Systems  Constitutional:  Negative for fatigue, fever and unexpected weight change.  Respiratory:  Negative for cough, shortness of breath and wheezing.   Cardiovascular:  Negative for chest pain, palpitations and leg swelling.  Gastrointestinal:  Negative for blood in stool, constipation, diarrhea, nausea and vomiting.  Endocrine: Negative for cold intolerance, heat intolerance and polyuria.  Genitourinary:  Negative for dyspareunia, dysuria, flank pain, frequency, genital sores, hematuria, menstrual problem, pelvic pain, urgency, vaginal bleeding, vaginal discharge and vaginal pain.  Musculoskeletal:  Negative for back pain, joint swelling and myalgias.   Skin:  Negative for rash.  Neurological:  Negative for dizziness, syncope, light-headedness, numbness and headaches.  Hematological:  Negative for adenopathy.  Psychiatric/Behavioral:  Negative for agitation, confusion, sleep disturbance and suicidal ideas. The patient is not nervous/anxious.   BREAST: No symptoms   Objective: BP 97/65   Pulse (!) 150   Ht 4' 11 (1.499 m)   Wt 181 lb (82.1 kg)   LMP 01/05/2023 (Exact Date)   BMI 36.56 kg/m    Physical Exam Constitutional:      Appearance: She is well-developed.  Genitourinary:     Vulva normal.     Right Labia: No rash, tenderness or lesions.    Left Labia: No tenderness, lesions or rash.    No vaginal discharge, erythema or tenderness.      Right Adnexa: not tender and no mass present.    Left Adnexa: not tender and no mass present.    No cervical friability or polyp.     Uterus is not enlarged or tender.  Breasts:    Right: No mass, nipple discharge, skin change or tenderness.     Left: No mass, nipple discharge, skin change or tenderness.  Neck:     Thyroid: No thyromegaly.  Cardiovascular:     Rate and Rhythm: Normal rate and regular rhythm.     Heart sounds: Normal heart sounds. No murmur heard. Pulmonary:     Effort: Pulmonary effort is normal.     Breath sounds: Normal breath sounds.  Abdominal:     Palpations: Abdomen is soft.     Tenderness: There is no abdominal tenderness. There is no guarding or rebound.  Musculoskeletal:        General: Normal range of motion.     Cervical back: Normal range of motion.  Lymphadenopathy:     Cervical: No cervical adenopathy.  Neurological:     General: No focal deficit present.     Mental Status: She is alert and oriented to person, place, and time.     Cranial Nerves: No cranial nerve deficit.  Skin:    General: Skin is warm and dry.  Psychiatric:        Mood and Affect: Mood normal.        Behavior: Behavior normal.        Thought Content: Thought content  normal.        Judgment: Judgment normal.  Vitals reviewed.     Assessment/Plan: Encounter for annual routine gynecological examination  Cervical cancer screening - Plan: Cytology - PAP  Screening for STD (sexually transmitted disease) - Plan: Cytology - PAP  Encounter for surveillance of contraceptive pills - Plan: norethindrone  (MICRONOR ) 0.35 MG tablet; Rx RF eRxd.    Meds ordered this encounter  Medications   norethindrone  (MICRONOR ) 0.35 MG tablet  Sig: Take 1 tablet (0.35 mg total) by mouth daily.    Dispense:  84 tablet    Refill:  3    Supervising Provider:   CHERRY, ANIKA [[id number]]             GYN counsel adequate intake of calcium and vitamin D, diet and exercise     F/U  Return in about 1 year (around 01/19/2024).  Brees Hounshell B. Cassiel Fernandez, PA-C 01/19/2023 10:12 AM

## 2023-01-19 ENCOUNTER — Encounter: Payer: Self-pay | Admitting: Obstetrics and Gynecology

## 2023-01-19 ENCOUNTER — Ambulatory Visit (INDEPENDENT_AMBULATORY_CARE_PROVIDER_SITE_OTHER): Payer: 59 | Admitting: Obstetrics and Gynecology

## 2023-01-19 ENCOUNTER — Other Ambulatory Visit (HOSPITAL_COMMUNITY)
Admission: RE | Admit: 2023-01-19 | Discharge: 2023-01-19 | Disposition: A | Discharge status: Home / Self Care | Payer: 59 | Source: Ambulatory Visit | Attending: Obstetrics and Gynecology | Admitting: Obstetrics and Gynecology

## 2023-01-19 VITALS — BP 97/65 | HR 150 | Ht 59.0 in | Wt 181.0 lb

## 2023-01-19 DIAGNOSIS — Z124 Encounter for screening for malignant neoplasm of cervix: Secondary | ICD-10-CM

## 2023-01-19 DIAGNOSIS — Z113 Encounter for screening for infections with a predominantly sexual mode of transmission: Secondary | ICD-10-CM

## 2023-01-19 DIAGNOSIS — Z01419 Encounter for gynecological examination (general) (routine) without abnormal findings: Secondary | ICD-10-CM | POA: Diagnosis not present

## 2023-01-19 DIAGNOSIS — Z3041 Encounter for surveillance of contraceptive pills: Secondary | ICD-10-CM

## 2023-01-19 MED ORDER — NORETHINDRONE 0.35 MG PO TABS: 1.0000 | ORAL_TABLET | Freq: Every day | ORAL | 3 refills | Status: DC

## 2023-01-19 NOTE — Patient Instructions (Signed)
 I value your feedback and you entrusting [place] with your care. If you get a [place] patient survey, I would appreciate you taking the time to let us know about your experience today. Thank you! ? ? ?

## 2023-01-22 LAB — CYTOLOGY - PAP
Chlamydia: NEGATIVE
Comment: NEGATIVE
Comment: NORMAL
Diagnosis: NEGATIVE
Neisseria Gonorrhea: NEGATIVE

## 2023-03-29 NOTE — Progress Notes (Signed)
 Pa, [employer] Complaint  Patient presents with   Contraception    Discuss BC due to migraines while taking BC pills. Stopped BC about 1 month ago.    HPI:      [name] is a 23 y.o. G0P0000 whose LMP was Patient's last menstrual period was 03/06/2023 (approximate)., presents today for [facility] consult. Had been on camila POPs but restarted them 12/24 with increased migraines with aura; was having headaches lasting 3 days and back-to-back, which is unusual for pt. Stopped them about a month ago, no migraines since. Stopped topamax in past due to improved headaches off birth control. Did depo with increased headaches, not interested in nexplanon or IUD. Not currently sexually active.   01/19/23 NOTE: Her menses are monthly on POPs, lasting 7 days, mod flow, no BTB, no dysmen. Stopped POPs for about 6 months but restarted 12/24. No migraine change off POPs. Periods worse with depo in past. Hx of menometrorrhagia. Hx of migraines with aura.   Patient Active Problem List   Diagnosis Date Noted   Irritable bowel syndrome 11/24/2020   Intractable migraine with aura without status migrainosus 02/11/2020   Migraine without aura and without status migrainosus, not intractable 11/21/2019   Heat syncope 10/07/2018    Past Surgical History:  Procedure Laterality Date   TONSILLECTOMY N/A 09/13/2017   Procedure: TONSILLECTOMY;  Surgeon: Vernie Murders, MD;  Location: [facility];  Service: ENT;  Laterality: N/A;  NO ADENOIDS PER MD   WISDOM TOOTH EXTRACTION      Family History  Problem Relation Age of Onset   Cystic fibrosis Mother 90   Multiple sclerosis Mother     Social History   Socioeconomic History   Marital status: Single    Spouse name: Not on file   Number of children: Not on file   Years of education: Not on file   Highest education level: Not on file  Occupational History   Not on file  Tobacco Use   Smoking status: Never   Smokeless  tobacco: Never  Vaping Use   Vaping status: Every Day   Substances: Nicotine, Flavoring  Substance and Sexual Activity   Alcohol use: No   Drug use: No   Sexual activity: Not Currently    Birth control/protection: None  Other Topics Concern   Not on file  Social History Narrative   Not on file   Social Drivers of Health   Financial Resource Strain: Low Risk  (01/19/2021)   Received from [facility], [facility]   Overall Financial Resource Strain (CARDIA)    Difficulty of Paying Living Expenses: Not hard at all  Food Insecurity: No Food Insecurity (02/12/2023)   Received from [facility]   Hunger Vital Sign    Worried About Running Out of Food in the Last Year: Never true    Ran Out of Food in the Last Year: Never true  Transportation Needs: No Transportation Needs (02/12/2023)   Received from [facility] - Transportation    Lack of Transportation (Medical): No    Lack of Transportation (Non-Medical): No  Physical Activity: Not on file  Stress: Not on file  Social Connections: Not on file  Intimate Partner Violence: Not on file    Outpatient Medications Prior to Visit  Medication Sig Dispense Refill   norethindrone (MICRONOR) 0.35 MG tablet Take 1 tablet (0.35 mg total) by mouth daily. (Patient not taking: Reported  on 04/02/2023) 84 tablet 3   No facility-administered medications prior to visit.      ROS:  Review of Systems  Constitutional:  Negative for fever.  Gastrointestinal:  Negative for blood in stool, constipation, diarrhea, nausea and vomiting.  Genitourinary:  Negative for dyspareunia, dysuria, flank pain, frequency, hematuria, urgency, vaginal bleeding, vaginal discharge and vaginal pain.  Musculoskeletal:  Negative for back pain.  Skin:  Negative for rash.  Neurological:  Positive for headaches.   BREAST: No symptoms   OBJECTIVE:   Vitals:  BP 105/73   Pulse 75   Ht 4\' 11"  (1.499 m)   Wt 186 lb (84.4 kg)   LMP 03/06/2023  (Approximate)   BMI 37.57 kg/m   Physical Exam Vitals reviewed.  Constitutional:      Appearance: She is well-developed.  Pulmonary:     Effort: Pulmonary effort is normal.  Musculoskeletal:        General: Normal range of motion.     Cervical back: Normal range of motion.  Skin:    General: Skin is warm and dry.  Neurological:     General: No focal deficit present.     Mental Status: She is alert and oriented to person, place, and time.     Cranial Nerves: No cranial nerve deficit.  Psychiatric:        Mood and Affect: Mood normal.        Behavior: Behavior normal.        Thought Content: Thought content normal.        Judgment: Judgment normal.     Assessment/Plan: Encounter for surveillance of contraceptive pills - Plan: Drospirenone (SLYND) 4 MG TABS; prog only options and phexxi discussed. Will try slynd; doesn't want depo due to increased HA in past; not interested in nexplanon/IUD. Rx eRxd. F/u prn.   Intractable migraine with aura without status migrainosus - Plan: Drospirenone (SLYND) 4 MG TABS   Meds ordered this encounter  Medications   Drospirenone (SLYND) 4 MG TABS    Sig: Take 1 tablet (4 mg total) by mouth daily.    Dispense:  84 tablet    Refill:  2    Supervising Provider:   Hildred Laser [[id number]]      Return if symptoms worsen or fail to improve.  Jamecia Lerman B. Breiona Couvillon, PA-C 04/02/2023 4:05 PM

## 2023-04-02 ENCOUNTER — Ambulatory Visit: Payer: 59 | Admitting: Obstetrics and Gynecology

## 2023-04-02 ENCOUNTER — Encounter: Payer: Self-pay | Admitting: Obstetrics and Gynecology

## 2023-04-02 VITALS — BP 105/73 | HR 75 | Ht 59.0 in | Wt 186.0 lb

## 2023-04-02 DIAGNOSIS — G43119 Migraine with aura, intractable, without status migrainosus: Secondary | ICD-10-CM

## 2023-04-02 DIAGNOSIS — Z3041 Encounter for surveillance of contraceptive pills: Secondary | ICD-10-CM

## 2023-04-02 MED ORDER — SLYND 4 MG PO TABS: 1.0000 | ORAL_TABLET | Freq: Every day | ORAL | 2 refills | Status: AC

## 2023-04-02 NOTE — Patient Instructions (Signed)
 I value your feedback and you entrusting [place] with your care. If you get a [place] patient survey, I would appreciate you taking the time to let us know about your experience today. Thank you! ? ? ?

## 2023-12-20 NOTE — Progress Notes (Unsigned)
° °  GYN ENCOUNTER  Subjective  HPI: Robyn Mcmahon is a 23 y.o. G0P0000 who presents today for discussion of recurrent UTI and yeast.  Tested + yeast 6/3 and 8/6. + BV 5/15.  11/12 Had telehealth visit with vaginitis sx and was treated presumptively for yeast with diflucan. 12/3 Had telehealth visit with dysuria and was treated presumptively with macrobid. She still notices some ammonia odor to her urine. She does not have current vaginitis sx.   Past Medical History:  Diagnosis Date   Chronic tonsillitis    Irritable bowel syndrome    Migraine with aura    every other day   Syncope    Past Surgical History:  Procedure Laterality Date   TONSILLECTOMY N/A 09/13/2017   Procedure: TONSILLECTOMY;  Surgeon: Edda Mt, MD;  Location: [facility];  Service: ENT;  Laterality: N/A;  NO ADENOIDS PER MD   WISDOM TOOTH EXTRACTION     OB History     Gravida  0   Para  0   Term  0   Preterm  0   AB  0   Living  0      SAB  0   IAB  0   Ectopic  0   Multiple  0   Live Births  0          Allergies[1]  Constitutional: Denied constitutional symptoms, night sweats, recent illness, fatigue, fever, insomnia and weight loss.  Eyes: Denied eye symptoms, eye pain, photophobia, vision change and visual disturbance.  Ears/Nose/Throat/Neck: Denied ear, nose, throat or neck symptoms, hearing loss, nasal discharge, sinus congestion and sore throat.  Cardiovascular: Denied cardiovascular symptoms, arrhythmia, chest pain/pressure, edema, exercise intolerance, orthopnea and palpitations.  Respiratory: Denied pulmonary symptoms, asthma, pleuritic pain, productive sputum, cough, dyspnea and wheezing.  Gastrointestinal: Denied, gastro-esophageal reflux, melena, nausea and vomiting.  Genitourinary: Denied CURRENT genitourinary symptoms including symptomatic vaginal discharge, pelvic relaxation issues, and urinary complaints.  Musculoskeletal: Denied musculoskeletal symptoms,  stiffness, swelling, muscle weakness and myalgia.  Dermatologic: Denied dermatology symptoms, rash and scar.  Neurologic: Denied neurology symptoms, dizziness, headache, neck pain and syncope.  Psychiatric: Denied psychiatric symptoms, anxiety and depression.  Endocrine: Denied endocrine symptoms including hot flashes and night sweats.    Objective  Ht 4' 11 (1.499 m)   Wt 80.2 kg   LMP 12/04/2023   BMI 35.73 kg/m   Physical examination Gen: NAD Pulm: Normal WOB CV: Well perfused  Assessment/ Plan UA is not suspicious for UTI, but will send culture due to hx of dysuria. Self swab for vaginitis panel. Will tx based on results.  Pt with hx of telehealth visits where sx were not confirmed with lab testing. Discussed avoiding if at all possible given her hx of recurrent sx that may not be actually related to condition being treated (may have BV when being treated for yeast, for example).   Lauraine Lakes, CNM       [1] No Known Allergies

## 2023-12-21 ENCOUNTER — Ambulatory Visit: Admitting: Registered Nurse

## 2023-12-21 ENCOUNTER — Encounter: Payer: Self-pay | Admitting: Registered Nurse

## 2023-12-21 ENCOUNTER — Other Ambulatory Visit (HOSPITAL_COMMUNITY)
Admission: RE | Admit: 2023-12-21 | Discharge: 2023-12-21 | Disposition: A | Source: Ambulatory Visit | Attending: Registered Nurse | Admitting: Registered Nurse

## 2023-12-21 VITALS — BP 126/82 | HR 116 | Ht 59.0 in | Wt 176.9 lb

## 2023-12-21 DIAGNOSIS — R8290 Unspecified abnormal findings in urine: Secondary | ICD-10-CM

## 2023-12-21 DIAGNOSIS — R350 Frequency of micturition: Secondary | ICD-10-CM

## 2023-12-21 DIAGNOSIS — N898 Other specified noninflammatory disorders of vagina: Secondary | ICD-10-CM

## 2023-12-21 LAB — POCT URINALYSIS DIPSTICK
Bilirubin, UA: NEGATIVE
Blood, UA: NEGATIVE
Glucose, UA: NEGATIVE
Ketones, UA: NEGATIVE
Leukocytes, UA: NEGATIVE
Nitrite, UA: NEGATIVE
Protein, UA: POSITIVE — AB
Spec Grav, UA: 1.015 (ref 1.010–1.025)
Urobilinogen, UA: 0.2 U/dL
pH, UA: 6.5 (ref 5.0–8.0)

## 2023-12-21 NOTE — Addendum Note (Signed)
 Addended by: MILLICENT MATHIS CROME on: 12/21/2023 10:17 AM   Modules accepted: Orders

## 2023-12-23 ENCOUNTER — Ambulatory Visit: Payer: Self-pay | Admitting: Registered Nurse

## 2023-12-23 LAB — URINE CULTURE

## 2023-12-24 LAB — CERVICOVAGINAL ANCILLARY ONLY
Bacterial Vaginitis (gardnerella): NEGATIVE
Candida Glabrata: NEGATIVE
Candida Vaginitis: NEGATIVE
Chlamydia: NEGATIVE
Comment: NEGATIVE
Comment: NEGATIVE
Comment: NEGATIVE
Comment: NEGATIVE
Comment: NEGATIVE
Comment: NORMAL
Neisseria Gonorrhea: NEGATIVE
Trichomonas: NEGATIVE
